# Patient Record
Sex: Male | Born: 1960
Health system: Southern US, Community
[De-identification: ages and names within clinical notes are randomized; demographics above are authoritative.]

## PROBLEM LIST (undated history)

## (undated) DIAGNOSIS — M1990 Unspecified osteoarthritis, unspecified site: Secondary | ICD-10-CM

## (undated) DIAGNOSIS — H57051 Tonic pupil, right eye: Secondary | ICD-10-CM

## (undated) DIAGNOSIS — T4145XA Adverse effect of unspecified anesthetic, initial encounter: Secondary | ICD-10-CM

## (undated) DIAGNOSIS — B001 Herpesviral vesicular dermatitis: Secondary | ICD-10-CM

## (undated) DIAGNOSIS — R112 Nausea with vomiting, unspecified: Secondary | ICD-10-CM

## (undated) DIAGNOSIS — I10 Essential (primary) hypertension: Secondary | ICD-10-CM

## (undated) DIAGNOSIS — R195 Other fecal abnormalities: Secondary | ICD-10-CM

## (undated) DIAGNOSIS — E785 Hyperlipidemia, unspecified: Secondary | ICD-10-CM

## (undated) DIAGNOSIS — T8859XA Other complications of anesthesia, initial encounter: Secondary | ICD-10-CM

## (undated) DIAGNOSIS — F419 Anxiety disorder, unspecified: Secondary | ICD-10-CM

## (undated) DIAGNOSIS — Z9889 Other specified postprocedural states: Secondary | ICD-10-CM

## (undated) HISTORY — DX: Essential (primary) hypertension: I10

## (undated) HISTORY — PX: OTHER SURGICAL HISTORY: SHX169

## (undated) HISTORY — DX: Tonic pupil, right eye: H57.051

## (undated) HISTORY — DX: Unspecified osteoarthritis, unspecified site: M19.90

## (undated) HISTORY — DX: Other fecal abnormalities: R19.5

## (undated) HISTORY — PX: SHOULDER ARTHROSCOPY: SHX128

## (undated) HISTORY — DX: Hyperlipidemia, unspecified: E78.5

## (undated) HISTORY — DX: Herpesviral vesicular dermatitis: B00.1

## (undated) HISTORY — DX: Anxiety disorder, unspecified: F41.9

---

## 1999-07-19 ENCOUNTER — Emergency Department (HOSPITAL_COMMUNITY): Admission: EM | Admit: 1999-07-19 | Discharge: 1999-07-19 | Payer: Self-pay | Admitting: Emergency Medicine

## 1999-07-19 ENCOUNTER — Encounter: Payer: Self-pay | Admitting: Emergency Medicine

## 2012-05-15 ENCOUNTER — Ambulatory Visit (INDEPENDENT_AMBULATORY_CARE_PROVIDER_SITE_OTHER): Payer: 59 | Admitting: Family Medicine

## 2012-05-15 ENCOUNTER — Encounter: Payer: Self-pay | Admitting: Family Medicine

## 2012-05-15 VITALS — BP 166/96 | HR 76 | Temp 98.0°F | Resp 16 | Ht 71.0 in | Wt 178.0 lb

## 2012-05-15 DIAGNOSIS — F411 Generalized anxiety disorder: Secondary | ICD-10-CM

## 2012-05-15 DIAGNOSIS — Z0000 Encounter for general adult medical examination without abnormal findings: Secondary | ICD-10-CM

## 2012-05-15 DIAGNOSIS — I10 Essential (primary) hypertension: Secondary | ICD-10-CM

## 2012-05-15 DIAGNOSIS — F419 Anxiety disorder, unspecified: Secondary | ICD-10-CM

## 2012-05-15 LAB — COMPREHENSIVE METABOLIC PANEL
ALT: 29 U/L (ref 0–53)
AST: 28 U/L (ref 0–37)
Albumin: 4 g/dL (ref 3.5–5.2)
Alkaline Phosphatase: 77 U/L (ref 39–117)
BUN: 17 mg/dL (ref 6–23)
CO2: 28 mEq/L (ref 19–32)
Calcium: 9.3 mg/dL (ref 8.4–10.5)
Chloride: 107 mEq/L (ref 96–112)
Creat: 1.2 mg/dL (ref 0.50–1.35)
Glucose, Bld: 92 mg/dL (ref 70–99)
Potassium: 4.4 mEq/L (ref 3.5–5.3)
Sodium: 141 mEq/L (ref 135–145)
Total Bilirubin: 0.7 mg/dL (ref 0.3–1.2)
Total Protein: 6.4 g/dL (ref 6.0–8.3)

## 2012-05-15 LAB — LIPID PANEL
Cholesterol: 210 mg/dL — ABNORMAL HIGH (ref 0–200)
HDL: 45 mg/dL (ref >39)
LDL Cholesterol: 145 mg/dL — ABNORMAL HIGH (ref 0–99)
Total CHOL/HDL Ratio: 4.7 Ratio
Triglycerides: 100 mg/dL (ref <150)
VLDL: 20 mg/dL (ref 0–40)

## 2012-05-15 LAB — CBC
HCT: 43.3 % (ref 39.0–52.0)
Hemoglobin: 15.4 g/dL (ref 13.0–17.0)
MCH: 31.6 pg (ref 26.0–34.0)
MCHC: 35.6 g/dL (ref 30.0–36.0)
MCV: 88.9 fL (ref 78.0–100.0)
Platelets: 283 10*3/uL (ref 150–400)
RBC: 4.87 MIL/uL (ref 4.22–5.81)
RDW: 12.6 % (ref 11.5–15.5)
WBC: 5.5 10*3/uL (ref 4.0–10.5)

## 2012-05-15 MED ORDER — ALPRAZOLAM 0.25 MG PO TABS: 0.2500 mg | ORAL_TABLET | Freq: Three times a day (TID) | ORAL | Status: DC | PRN

## 2012-05-15 MED ORDER — LISINOPRIL 5 MG PO TABS: 5.0000 mg | ORAL_TABLET | Freq: Every day | ORAL | Status: DC

## 2012-05-15 NOTE — Patient Instructions (Signed)
Keep a record of your blood pressures outside of the office and bring them to the next office visit. Your should receive a call or letter about your lab results within the next week to 10 days.  You can schedule a screening colonoscopy after 50 years old.  Recheck in the next 6 to 8 weeks to recheck blood pressure and discuss other concerns from this visit. Return to the clinic or go to the nearest emergency room if any of your symptoms worsen or new symptoms occur.

## 2012-05-15 NOTE — Progress Notes (Signed)
Subjective:    Patient ID: Kelly Sanders, male    DOB: May 28, 1961, 51 y.o.   MRN: [id number]  HPI  Kelly Sanders is a 51 y.o. male Here for CPE.   Also notes increasing back pain for 3 years.  - seen by spine specialist 2 years ago - x ray ok.  Mid to lower back.  No bowel or bladder incontinence, no saddle anesthesia, no lower extremity weakness.   Hurts when sleeping tried different beds, etc.   Episodic anxiety - every few months.  On and off wellbutin and cymbalta for pst 9 years for depression.  Dizzy with cymbalta? As needed xanax - up to each day for few days, then not needed for weeks.  Stress at work for years.  No suicide thoughts.  More anxious than depressed.  No SI.  Trouble with concentration past few years. Not necessarily associated with stress. Memory bad at times.  Geophysicist/field seismologist.  Outside BP's 180/90 at times, usually in 170's.    Review of Systems See scanned PHS.     Objective:   Physical Exam  Constitutional: He is oriented to person, place, and time. He appears well-developed and well-nourished.  HENT:  Head: Normocephalic and atraumatic.  Right Ear: External ear normal.  Left Ear: External ear normal.  Mouth/Throat: Oropharynx is clear and moist.  Eyes: Conjunctivae and EOM are normal. Pupils are equal, round, and reactive to light.  Neck: Normal range of motion. Neck supple. No thyromegaly present.  Cardiovascular: Normal rate, regular rhythm, normal heart sounds and intact distal pulses.   Pulmonary/Chest: Effort normal and breath sounds normal. No respiratory distress. He has no wheezes.  Abdominal: Soft. He exhibits no distension. There is no tenderness. Hernia confirmed negative in the right inguinal area and confirmed negative in the left inguinal area.  Genitourinary: Prostate normal.  Musculoskeletal: Normal range of motion. He exhibits no edema and no tenderness.  Lymphadenopathy:    He has no cervical adenopathy.    Neurological: He is alert and oriented to person, place, and time. He has normal reflexes.  Skin: Skin is warm and dry.  Psychiatric: He has a normal mood and affect. His behavior is normal.    EKG: no apparent change form 4/12.      Assessment & Plan:  Kelly Sanders is a 51 y.o. male 1. Physical exam, annual  PSA, CBC, Comprehensive metabolic panel, Lipid panel, EKG 12-Lead  2. Hypertension  PSA, CBC, Comprehensive metabolic panel, Lipid panel, EKG 12-Lead  3. Anxiety     CPE- Check pSa, lipids, CMP,CBC.  Phone numbers for setting up screening colonoscopy.   Anxiety - episodic.  No SSRI desired at this point, but can try Anxiety for Dummies book, and recheck to discuss further. Cont xanax 0.25mg  xanax up to TID - but infrequent use. Discuss concentration/memory further at next visit.  Increase exercise to most days of the week.   Low back pain. Recurrent - back care manual,and rtc to discuss and review prior evals in next 2-3 months.  Sooner if worse.   HTn - based on outside BP's and prior readings.  Start lisinopril 5mg  qd.  Check outside BP's, creat todayand recheck in 6-8 weeks.   Patient Instructions  Keep a record of your blood pressures outside of the office and bring them to the next office visit. Your should receive a call or letter about your lab results within the next week to 10 days.  You can  schedule a screening colonoscopy after 50 years old.  Recheck in the next 6 to 8 weeks to recheck blood pressure and discuss other concerns from this visit. Return to the clinic or go to the nearest emergency room if any of your symptoms worsen or new symptoms occur.

## 2012-05-16 LAB — PSA: PSA: 0.22 ng/mL (ref <=4.00)

## 2012-06-11 ENCOUNTER — Telehealth: Payer: Self-pay

## 2012-06-11 NOTE — Telephone Encounter (Signed)
PT STATES HE RECEIVED A LETTER STATING WE WERE TRYING TO GET IN [facility] WITH HIM REGARDING HIS LABS PLEASE CALL [phone]

## 2012-06-12 ENCOUNTER — Telehealth: Payer: Self-pay

## 2012-06-12 NOTE — Telephone Encounter (Signed)
Dr Neva Seat, your notes commenting on pt's labs are not in pts chart. All that shows is your comment to "see other notes". Did you have any instr's concerning pts elevated lipids, or just have him return in 6-8 weeks as in your OV notes and you will discuss at that time?

## 2012-06-12 NOTE — Telephone Encounter (Signed)
Pt calling states no one has returned his call about labs, his phone numbers have changed and have been noted in his mr please contact pt on his cell [phone]

## 2012-06-22 NOTE — Telephone Encounter (Signed)
Call pt - cholesterol mildly elevated at last ov. Other labs including electrolytes, psa and blood counts ok.   Can discuss options, including if meds needed at follow up visit.  Should be rechecking in next few weeks to recheck kidney function and blood pressure.  please contact pt on his cell [phone].

## 2012-06-22 NOTE — Telephone Encounter (Signed)
Gave pt results and instr's from Dr Neva Seat. Pt agreed to f/up in a few weeks as planned.

## 2012-07-10 ENCOUNTER — Ambulatory Visit: Payer: 59 | Admitting: Family Medicine

## 2012-07-17 ENCOUNTER — Encounter: Payer: Self-pay | Admitting: Family Medicine

## 2012-07-17 ENCOUNTER — Ambulatory Visit (INDEPENDENT_AMBULATORY_CARE_PROVIDER_SITE_OTHER): Payer: 59 | Admitting: Family Medicine

## 2012-07-17 VITALS — BP 152/68 | HR 74 | Temp 97.9°F | Resp 16 | Ht 70.0 in | Wt 178.0 lb

## 2012-07-17 DIAGNOSIS — F419 Anxiety disorder, unspecified: Secondary | ICD-10-CM

## 2012-07-17 DIAGNOSIS — I10 Essential (primary) hypertension: Secondary | ICD-10-CM

## 2012-07-17 DIAGNOSIS — E785 Hyperlipidemia, unspecified: Secondary | ICD-10-CM

## 2012-07-17 DIAGNOSIS — M545 Low back pain, unspecified: Secondary | ICD-10-CM

## 2012-07-17 DIAGNOSIS — F411 Generalized anxiety disorder: Secondary | ICD-10-CM

## 2012-07-17 DIAGNOSIS — M25529 Pain in unspecified elbow: Secondary | ICD-10-CM

## 2012-07-17 DIAGNOSIS — M549 Dorsalgia, unspecified: Secondary | ICD-10-CM

## 2012-07-17 LAB — BASIC METABOLIC PANEL
BUN: 14 mg/dL (ref 6–23)
CO2: 30 mEq/L (ref 19–32)
Calcium: 9.5 mg/dL (ref 8.4–10.5)
Chloride: 104 mEq/L (ref 96–112)
Creat: 1.11 mg/dL (ref 0.50–1.35)
Glucose, Bld: 94 mg/dL (ref 70–99)
Potassium: 3.9 mEq/L (ref 3.5–5.3)
Sodium: 140 mEq/L (ref 135–145)

## 2012-07-17 MED ORDER — LISINOPRIL 5 MG PO TABS: 5.0000 mg | ORAL_TABLET | Freq: Every day | ORAL | Status: DC

## 2012-07-17 MED ORDER — ALPRAZOLAM 0.25 MG PO TABS: 0.2500 mg | ORAL_TABLET | Freq: Three times a day (TID) | ORAL | Status: DC | PRN

## 2012-07-17 NOTE — Progress Notes (Signed)
Subjective:    Patient ID: Kelly Sanders, male    DOB: 1961-03-23, 51 y.o.   MRN: [id number]  HPI Kelly Sanders is a 51 y.o. male  See last ov.   HTN - outside Bp's. [phone].  No new side effects with meds. Started lisinopril 5mg  in July.   No missed doses.   Hyperlipidemia - labs from last office visit: Results for orders placed in visit on 05/15/12  PSA      Component Value Range   PSA 0.22  <=4.00 ng/mL  CBC      Component Value Range   WBC 5.5  4.0 - 10.5 K/uL   RBC 4.87  4.22 - 5.81 MIL/uL   Hemoglobin 15.4  13.0 - 17.0 g/dL   HCT 82.9  56.2 - 13.0 %   MCV 88.9  78.0 - 100.0 fL   MCH 31.6  26.0 - 34.0 pg   MCHC 35.6  30.0 - 36.0 g/dL   RDW 86.5  78.4 - 69.6 %   Platelets 283  150 - 400 K/uL  COMPREHENSIVE METABOLIC PANEL      Component Value Range   Sodium 141  135 - 145 mEq/L   Potassium 4.4  3.5 - 5.3 mEq/L   Chloride 107  96 - 112 mEq/L   CO2 28  19 - 32 mEq/L   Glucose, Bld 92  70 - 99 mg/dL   BUN 17  6 - 23 mg/dL   Creat 2.95  2.84 - 1.32 mg/dL   Total Bilirubin 0.7  0.3 - 1.2 mg/dL   Alkaline Phosphatase 77  39 - 117 U/L   AST 28  0 - 37 U/L   ALT 29  0 - 53 U/L   Total Protein 6.4  6.0 - 8.3 g/dL   Albumin 4.0  3.5 - 5.2 g/dL   Calcium 9.3  8.4 - 44.0 mg/dL  LIPID PANEL      Component Value Range   Cholesterol 210 (*) 0 - 200 mg/dL   Triglycerides 102  <725 mg/dL   HDL 45  >36 mg/dL   Total CHOL/HDL Ratio 4.7     VLDL 20  0 - 40 mg/dL   LDL Cholesterol 644 (*) 0 - 99 mg/dL   No change in diet - fish oil every now and then. No known hx early CAD or CVD. Would like to try diet and exercise for few months.  Occasional problems with anxiety - can occur for a week at a time - then some weeks none.  Only taking xanax 6 times per month (usually after 2 days in a row of anxiety).  Has not purchased anxiety for Dummies. Would consider therapist to discuss this, but intolerance to SSRI's in past.   Other concerns: R Elbow problem - seen by Dr.  Melvyn Novas in past - told needed surgery. Told needed bone spur removed. Has occasional flair in pain.  Plans on calling ortho to follow up with this.   Back pain - See last ov - back pain - recurrent for years. - takes 4 ibuprofen before bed - sometimes in morning.  No bowel or bladder incontinence, no saddle anesthesia, no lower extremity weakness. Seen by specialist in past - ?arthritis due to old fracture? No recent changes in sx's.   Review of Systems  Constitutional: Negative for fatigue and unexpected weight change.  Respiratory: Negative for cough, chest tightness and shortness of breath.   Cardiovascular: Negative for chest pain, palpitations and leg  swelling.  Genitourinary: Negative for difficulty urinating.  Musculoskeletal: Positive for myalgias and back pain.  Neurological: Negative for dizziness, weakness, light-headedness and headaches.       No le weakness. No bowel/bladder incontinence, no saddle anesthesia.   As above.     Objective:   Physical Exam  Musculoskeletal:       Lumbar back: He exhibits normal range of motion, no tenderness and no bony tenderness.       Back:       Arms: Neurological: He has normal strength. No sensory deficit.  Reflex Scores:      Patellar reflexes are 2+ on the right side and 2+ on the left side.      Achilles reflexes are 2+ on the right side and 2+ on the left side.      Normal heel to toe walk.         Assessment & Plan:  Kelly Sanders is a 51 y.o. male 1. Essential hypertension, benign  Basic metabolic panel  2. Lumbago    3. Other and unspecified hyperlipidemia    4. Anxiety state, unspecified    5. Elbow pain      HTn - borderline - possible component due to NSAIDS - will keep same dose of meds at present and decrease nsaids. Recheck in 3 months. Check outside bp's.   LBP - try to transition to tylenol, less ibuprofen, and try back care manual HEP.  Consider reimaging after review of prior records and eval.  R elbow  pain - intermittent with flairs and underlying bone spur. Plan to follow up with Dr. Melvyn Novas as eval there prior.   Anxiety - intermittent flairs. SSRI deferred at this point, can continue xanax prn. Phone numbers for therapists given as he may be amenable to this approach now.  Can also pick up Anxiety for Dummies book as discussed prior. .   Hyperlipidemia - work on diet and increase fish oil.  Recheck in 3 months.  Discussed statin - declined at present - will try diet first.

## 2012-07-17 NOTE — Patient Instructions (Addendum)
Recheck  In the next few months for your back pain.  Try tylenol and lower dose of ibuprofen, and exercises in manual.  Keep a record of your blood pressures outside of the office and bring them to the next office visit. Fish oil and diet changes for cholesterol, and recheck levels in 3 months.  Your should receive a call or letter about your lab results within the next week to 10 days.   Counselors: Arbutus Ped: [phone] Barbie Haggis: [phone]

## 2012-07-18 DIAGNOSIS — F419 Anxiety disorder, unspecified: Secondary | ICD-10-CM | POA: Insufficient documentation

## 2012-07-18 DIAGNOSIS — I10 Essential (primary) hypertension: Secondary | ICD-10-CM | POA: Insufficient documentation

## 2012-09-21 ENCOUNTER — Ambulatory Visit: Payer: 59 | Admitting: Family Medicine

## 2012-11-18 ENCOUNTER — Other Ambulatory Visit: Payer: Self-pay | Admitting: Family Medicine

## 2012-11-18 DIAGNOSIS — F419 Anxiety disorder, unspecified: Secondary | ICD-10-CM

## 2012-11-20 NOTE — Telephone Encounter (Signed)
Patient advised.

## 2013-02-06 ENCOUNTER — Other Ambulatory Visit: Payer: Self-pay | Admitting: Family Medicine

## 2013-02-06 NOTE — Telephone Encounter (Signed)
Per last OV pt to follow up Dec 2013 for recheck of BP

## 2013-03-21 ENCOUNTER — Other Ambulatory Visit: Payer: Self-pay | Admitting: Family Medicine

## 2013-06-29 ENCOUNTER — Other Ambulatory Visit: Payer: Self-pay | Admitting: Physician Assistant

## 2013-06-30 NOTE — Telephone Encounter (Signed)
Needs OV.  

## 2013-08-02 ENCOUNTER — Encounter: Payer: Self-pay | Admitting: Family Medicine

## 2013-08-02 ENCOUNTER — Ambulatory Visit (INDEPENDENT_AMBULATORY_CARE_PROVIDER_SITE_OTHER): Payer: 59 | Admitting: Family Medicine

## 2013-08-02 VITALS — BP 133/80 | HR 67 | Temp 98.7°F | Resp 16 | Ht 70.5 in | Wt 182.0 lb

## 2013-08-02 DIAGNOSIS — F411 Generalized anxiety disorder: Secondary | ICD-10-CM

## 2013-08-02 DIAGNOSIS — I10 Essential (primary) hypertension: Secondary | ICD-10-CM

## 2013-08-02 DIAGNOSIS — E785 Hyperlipidemia, unspecified: Secondary | ICD-10-CM

## 2013-08-02 MED ORDER — LOSARTAN POTASSIUM 50 MG PO TABS: 25.0000 mg | ORAL_TABLET | Freq: Every day | ORAL | Status: DC

## 2013-08-02 MED ORDER — CLONAZEPAM 0.5 MG PO TABS: 0.2500 mg | ORAL_TABLET | Freq: Two times a day (BID) | ORAL | Status: DC | PRN

## 2013-08-02 NOTE — Patient Instructions (Addendum)
Stop lisinopril, stop xanax.  Start 1/2 tablet each day of losartan.  The cough should resolve with this if due to lisinopril. Return in next 2 months for fasting bloodwork (can schedule as a physical if you would like). Keep a record of your blood pressures outside of the office and bring them to the next office visit. Return to the clinic or go to the nearest emergency room if any of your symptoms worsen or new symptoms occur. Stop xanax, ok to try klonopin as discussed, and can pick up the book  Anxiety for Dummies for more information.

## 2013-08-02 NOTE — Progress Notes (Signed)
Subjective:    Patient ID: Kelly Sanders, male    DOB: 1961/02/04, 52 y.o.   MRN: [id number]  HPI  Kelly Sanders is a 52 y.o. male  Last ov 07/17/12. HTN - - borderline at last ov - planned to recheck in 3 months as thought to be NSAID component. No recent follow up.  Outside bp's: 133/86. Occasional neck flushing seen by others, but not felt, and not at times of stress. NO chest pains,    Results for orders placed in visit on 07/17/12  BASIC METABOLIC PANEL      Result Value Range   Sodium 140  135 - 145 mEq/L   Potassium 3.9  3.5 - 5.3 mEq/L   Chloride 104  96 - 112 mEq/L   CO2 30  19 - 32 mEq/L   Glucose, Bld 94  70 - 99 mg/dL   BUN 14  6 - 23 mg/dL   Creat 8.11  9.14 - 7.82 mg/dL   Calcium 9.5  8.4 - 95.6 mg/dL    Anxiety - intermittent flairs. - about twice per month.  SSRI deferred prior. Xanax works ok, but day after feels more anxious.  phone numbers for therapists given last ov, but did not feel like was needed Has not bought Anxiety for Dummies book yet. Would like to try something different than xanax.  Last true panic attack over 6 months ago.   Hyperlipidemia - LDL 145  In July 2013. Plan at last ov to work on diet and increase fish oil and recheck in 3 months. statin declined last ov. Not fasting today. Weight from 178 to 182.   Review of Systems  Constitutional: Negative for unexpected weight change. Fatigue: in the fall.   Eyes: Negative for visual disturbance.  Respiratory: Positive for cough (has had dry cough - since starting lisinopril. ). Negative for chest tightness and shortness of breath.   Cardiovascular: Negative for chest pain, palpitations and leg swelling.  Gastrointestinal: Negative for abdominal pain and blood in stool.  Neurological: Negative for dizziness, light-headedness and headaches.       Objective:   Physical Exam  Vitals reviewed. Constitutional: He is oriented to person, place, and time. He appears well-developed and  well-nourished.  HENT:  Head: Normocephalic and atraumatic.  Eyes: EOM are normal. Pupils are equal, round, and reactive to light.  Neck: No JVD present. Carotid bruit is not present.  Cardiovascular: Normal rate, regular rhythm and normal heart sounds.   No murmur heard. Pulmonary/Chest: Effort normal and breath sounds normal. He has no rales.  Musculoskeletal: He exhibits no edema.  Neurological: He is alert and oriented to person, place, and time.  Skin: Skin is warm and dry.  Psychiatric: He has a normal mood and affect. His behavior is normal. Judgment and thought content normal.      Assessment & Plan:  Kelly Sanders is a 52 y.o. male HTN -borderline, but slight cough - likely ACE-I effect. Stop lisinopril. Start losartan.  Can discuss flushing if persists on new med. Check BMP.   Anxiety state, unspecified - Plan: clonazePAM (KLONOPIN) 0.5 MG tablet rx in place of xanax, discussed Anxiety for Dummies book again. Given infrequent sx's, can hold on SSRI, and counseling at this point.   Other and unspecified hyperlipidemia - plan on FLP at next ov - discussed scheduling physical.  Meds ordered this encounter  Medications  . clonazePAM (KLONOPIN) 0.5 MG tablet    Sig: Take 0.5-1 tablets (0.25-0.5 mg  total) by mouth 2 (two) times daily as needed for anxiety.    Dispense:  30 tablet    Refill:  0  . losartan (COZAAR) 50 MG tablet    Sig: Take 0.5 tablets (25 mg total) by mouth daily.    Dispense:  30 tablet    Refill:  1    Patient Instructions  Stop lisinopril, stop xanax.  Start 1/2 tablet each day of losartan.  The cough should resolve with this if due to lisinopril. Return in next 2 months for fasting bloodwork (can schedule as a physical if you would like). Keep a record of your blood pressures outside of the office and bring them to the next office visit. Return to the clinic or go to the nearest emergency room if any of your symptoms worsen or new symptoms occur. Stop  xanax, ok to try klonopin as discussed, and can pick up the book  Anxiety for Dummies for more information.

## 2013-08-03 LAB — BASIC METABOLIC PANEL
BUN: 18 mg/dL (ref 6–23)
Creat: 1.15 mg/dL (ref 0.50–1.35)
Potassium: 4 mEq/L (ref 3.5–5.3)

## 2013-08-05 ENCOUNTER — Encounter: Payer: Self-pay | Admitting: *Deleted

## 2013-09-09 ENCOUNTER — Other Ambulatory Visit: Payer: Self-pay

## 2013-10-04 ENCOUNTER — Encounter: Payer: Self-pay | Admitting: Family Medicine

## 2013-10-04 ENCOUNTER — Ambulatory Visit (INDEPENDENT_AMBULATORY_CARE_PROVIDER_SITE_OTHER): Payer: 59 | Admitting: Family Medicine

## 2013-10-04 ENCOUNTER — Telehealth: Payer: Self-pay | Admitting: *Deleted

## 2013-10-04 VITALS — BP 116/80 | HR 87 | Temp 98.7°F | Resp 16 | Ht 70.0 in | Wt 182.4 lb

## 2013-10-04 DIAGNOSIS — R1031 Right lower quadrant pain: Secondary | ICD-10-CM

## 2013-10-04 DIAGNOSIS — R195 Other fecal abnormalities: Secondary | ICD-10-CM

## 2013-10-04 DIAGNOSIS — M25559 Pain in unspecified hip: Secondary | ICD-10-CM

## 2013-10-04 DIAGNOSIS — R319 Hematuria, unspecified: Secondary | ICD-10-CM

## 2013-10-04 DIAGNOSIS — M25552 Pain in left hip: Secondary | ICD-10-CM

## 2013-10-04 DIAGNOSIS — Z23 Encounter for immunization: Secondary | ICD-10-CM

## 2013-10-04 DIAGNOSIS — I10 Essential (primary) hypertension: Secondary | ICD-10-CM

## 2013-10-04 DIAGNOSIS — E785 Hyperlipidemia, unspecified: Secondary | ICD-10-CM

## 2013-10-04 DIAGNOSIS — Z0000 Encounter for general adult medical examination without abnormal findings: Secondary | ICD-10-CM

## 2013-10-04 LAB — CBC
MCHC: 35.7 g/dL (ref 30.0–36.0)
MCV: 91.8 fL (ref 78.0–100.0)
Platelets: 266 10*3/uL (ref 150–400)
RBC: 4.76 MIL/uL (ref 4.22–5.81)
RDW: 12.6 % (ref 11.5–15.5)
WBC: 5.2 10*3/uL (ref 4.0–10.5)

## 2013-10-04 LAB — POCT UA - MICROSCOPIC ONLY
Bacteria, U Microscopic: NEGATIVE
Crystals, Ur, HPF, POC: NEGATIVE
Epithelial cells, urine per micros: NEGATIVE

## 2013-10-04 LAB — POCT URINALYSIS DIPSTICK
Ketones, UA: NEGATIVE
Protein, UA: NEGATIVE
Spec Grav, UA: 1.02
Urobilinogen, UA: 0.2
pH, UA: 6

## 2013-10-04 MED ORDER — MELOXICAM 7.5 MG PO TABS: 7.5000 mg | ORAL_TABLET | Freq: Every day | ORAL | Status: DC

## 2013-10-04 MED ORDER — LOSARTAN POTASSIUM 50 MG PO TABS: 25.0000 mg | ORAL_TABLET | Freq: Every day | ORAL | Status: DC

## 2013-10-04 NOTE — Patient Instructions (Addendum)
We will refer you to gastroenterologist for abdominal pain and colonoscopy, but Return to the clinic or go to the nearest emergency room if any of your symptoms worsen or new symptoms occur. We will also refer you to ortho for your hip, but can try tylenol few times per day for now, and stop nsaids for now as possible blood in stool and because of your abdominal pain. Can also decrease exercise to hip for next few weeks.  We will check a urine culture for infection, and return in next few weeks for repeat urine testing  - if blood remains in urine - can discuss further workup then.  You should receive a call or letter about your lab results within the next week to 10 days.   Keeping you healthy  Get these tests  Blood pressure- Have your blood pressure checked once a year by your healthcare provider.  Normal blood pressure is 120/80  Weight- Have your body mass index (BMI) calculated to screen for obesity.  BMI is a measure of body fat based on height and weight. You can also calculate your own BMI at [email or web address].  Cholesterol- Have your cholesterol checked every year.  Diabetes- Have your blood sugar checked regularly if you have high blood pressure, high cholesterol, have a family history of diabetes or if you are overweight.  Screening for Colon Cancer- Colonoscopy starting at age 36.  Screening may begin sooner depending on your family history and other health conditions. Follow up colonoscopy as directed by your Gastroenterologist.  Screening for Prostate Cancer- Both blood work (PSA) and a rectal exam help screen for Prostate Cancer.  Screening begins at age 51 with African-American men and at age 19 with Caucasian men.  Screening may begin sooner depending on your family history.  Take these medicines  Aspirin- One aspirin daily can help prevent Heart disease and Stroke.  Flu shot- Every fall.  Tetanus- Every 10 years.  Zostavax- Once after the age of 13 to prevent  Shingles.  Pneumonia shot- Once after the age of 51; if you are younger than 40, ask your healthcare provider if you need a Pneumonia shot.  Take these steps  Don't smoke- If you do smoke, talk to your doctor about quitting.  For tips on how to quit, go to www.smokefree.gov or call 1-800-QUIT-NOW.  Be physically active- Exercise 5 days a week for at least 30 minutes.  If you are not already physically active start slow and gradually work up to 30 minutes of moderate physical activity.  Examples of moderate activity include walking briskly, mowing the yard, dancing, swimming, bicycling, etc.  Eat a healthy diet- Eat a variety of healthy food such as fruits, vegetables, low fat milk, low fat cheese, yogurt, lean meant, poultry, fish, beans, tofu, etc. For more information go to www.thenutritionsource.org  Drink alcohol in moderation- Limit alcohol intake to less than two drinks a day. Never drink and drive.  Dentist- Brush and floss twice daily; visit your dentist twice a year.  Depression- Your emotional health is as important as your physical health. If you're feeling down, or losing interest in things you would normally enjoy please talk to your healthcare provider.  Eye exam- Visit your eye doctor every year.  Safe sex- If you may be exposed to a sexually transmitted infection, use a condom.  Seat belts- Seat belts can save your life; always wear one.  Smoke/Carbon Monoxide detectors- These detectors need to be installed on the appropriate level of your  home.  Replace batteries at least once a year.  Skin cancer- When out in the sun, cover up and use sunscreen 15 SPF or higher.  Violence- If anyone is threatening you, please tell your healthcare provider.  Living Will/ Health care power of attorney- Speak with your healthcare provider and family.

## 2013-10-04 NOTE — Progress Notes (Signed)
Subjective:    Patient ID: Kelly Sanders, male    DOB: Apr 17, 1961, 52 y.o.   MRN: [id number]  HPI [name] is a 52 y.o. male Here for CPE/annual exam.  Last seen for medication follow up 08/02/13.  Colonoscopy: has not had done. No FH of colon CA.  PSA: no FH of prostate cancer. Agrees to have testing today.  Lab Results  Component Value Date   PSA 0.22 05/15/2012  Flu vaccine:not yet had.  Refuses today. Feels like made him sick past 2 times. Discussed unlikely d/t flu vaccine, but still declines.  Tdap: unknown - at least  7 or 8 years maybe more. Will have today.  Optho: no recent eval  - about 4-5 years ago.  Dentist: last eval few weeks ago.  Advanced Directives: has not discussed  With family, but will. Full code.   HTN -borderline, but slight cough last ov,likely ACE-I effect. Stopped lisinopril. Started losartan 50mg  qd. Creat 1.15 last ov. Home BP: 118/80.  No new side effects of meds.     Chemistry      Component Value Date/Time   NA 140 08/02/2013 1720   K 4.0 08/02/2013 1720   CL 101 08/02/2013 1720   CO2 26 08/02/2013 1720   BUN 18 08/02/2013 1720   CREATININE 1.15 08/02/2013 1720      Component Value Date/Time   CALCIUM 9.6 08/02/2013 1720   ALKPHOS 77 05/15/2012 0831   AST 28 05/15/2012 0831   ALT 29 05/15/2012 0831   BILITOT 0.7 05/15/2012 0831     Anxiety state, unspecified - Klonopin rx in place of xanax last ov as felt like more anxiety day after taking Xanax. discussed Anxiety for Dummies book again last ov. Infrequent panic sx's. Has only taken Klnopin once -worked ok.  Exercise helping with anxiety.   Hyperlipidemia - plan on FLP today. Prior lipids as below in 2013 with LDL 145. Plan for recheck then in 3 months after diet/exercise. Declined statin then.  Fasting labs drawn this am.   Lab Results  Component Value Date   CHOL 210* 05/15/2012   HDL 45 05/15/2012   LDLCALC 145* 05/15/2012   TRIG 100 05/15/2012   CHOLHDL 4.7 05/15/2012   R lower  abdominal pain - off and on for a year - last few minutes, and goes away on own. Every few weeks. Few weeks ago - lasted few days, dull pain, then resolves. No N/V/D/C, bowels normal during episodes. No blood in stool.  No fever, no urinary symptoms at time of pain. Last had a week ago, minimal sore with pressing on area only. Tx: ibuprofen for back pain only.   L hip pain - past 1 year. NKI. Has not been evaluated for this, but has seen [place] [facility] for back, shoulder surgeries. Last few months seem worse, but has increased exercise - [employer] 3x week - swim, raquetball, weights, treadmill, elliptical. Noticed more with exercise bike. Tx: ibuprofen for back - about 4-5 times per week. Would like to see [facility] - Dr. Thomasena Edis seen in past.     Review of Systems 13 point review of systems per patient health survey noted.  Negative other than as indicated on reviewed nursing note or above.      Objective:   Physical Exam  Vitals reviewed. Constitutional: He is oriented to person, place, and time. He appears well-developed and well-nourished.  HENT:  Head: Normocephalic and atraumatic.  Right Ear: External ear normal.  Left Ear: External ear normal.  Mouth/Throat: Oropharynx is clear and moist.  Eyes: Conjunctivae and EOM are normal. Pupils are equal, round, and reactive to light.  Neck: Normal range of motion. Neck supple. No thyromegaly present.  Cardiovascular: Normal rate, regular rhythm, normal heart sounds and intact distal pulses.   Pulmonary/Chest: Effort normal and breath sounds normal. No respiratory distress. He has no wheezes.  Abdominal: Soft. Normal appearance and bowel sounds are normal. He exhibits no distension. There is no hepatosplenomegaly. There is no tenderness (min RLQ with deep palpation only. ). No hernia. Hernia confirmed negative in the right inguinal area and confirmed negative in the left inguinal area.  Genitourinary: Prostate normal.  Musculoskeletal: Normal range  of motion. He exhibits no edema and no tenderness.  Lymphadenopathy:    He has no cervical adenopathy.  Neurological: He is alert and oriented to person, place, and time. He has normal reflexes.  Skin: Skin is warm and dry.  Psychiatric: He has a normal mood and affect. His behavior is normal.   Filed Vitals:   10/04/13 1418  BP: 116/80  Pulse: 87  Temp: 98.7 F (37.1 C)  TempSrc: Oral  Resp: 16  Height: 5\' 10"  (1.778 m)  Weight: 182 lb 6.4 oz (82.736 kg)  SpO2: 96%    Visual Acuity Screening   Right eye Left eye Both eyes  Without correction: 20/25 20/40 20/20   With correction:      Results for orders placed in visit on 10/04/13  POCT UA - MICROSCOPIC ONLY      Result Value Range   WBC, Ur, HPF, POC 0-1     RBC, urine, microscopic 3-6     Bacteria, U Microscopic neg     Mucus, UA trace     Epithelial cells, urine per micros neg     Crystals, Ur, HPF, POC neg     Casts, Ur, LPF, POC neg     Yeast, UA neg    POCT URINALYSIS DIPSTICK      Result Value Range   Color, UA yellow     Clarity, UA clear     Glucose, UA neg     Bilirubin, UA neg     Ketones, UA neg     Spec Grav, UA 1.020     Blood, UA small     pH, UA 6.0     Protein, UA neg     Urobilinogen, UA 0.2     Nitrite, UA neg     Leukocytes, UA Negative    IFOBT (OCCULT BLOOD)      Result Value Range   IFOBT Positive        Assessment & Plan:   Kelly Sanders is a 52 y.o. male HTN (hypertension) - Plan: losartan (COZAAR) 50 MG tablet, Comprehensive metabolic panel, Lipid panel - stable, cont cozaar, cmp pending.  Other and unspecified hyperlipidemia - Plan: Comprehensive metabolic panel, Lipid panel drawn. Discussed possible statin if still elevated, but now that he is exercising - should be improving.  Annual physical exam - Plan: PSA, CBC, POCT UA - Microscopic Only, POCT urinalysis dipstick, TSH, IFOBT POC (occult bld, rslt in office).  Labs above, anticipatory guidance given. Flu vaccine  refused.  Left hip pain - Plan: Ambulatory referral to Orthopedic Surgery, DISCONTINUED: meloxicam (MOBIC) 7.5 MG tablet.  Suspect OA vs overuse component with incr in exercise, but given duration of sx's - refer to ortho.  Discussed XR, but will have done there likely, and  tylenol if needed (initially Rx Mobic. But with hematuria and heme positive stool, with abd pain intermittent - avoid this and nsaids for now.   RLQ abdominal pain - Plan: IFOBT POC (occult bld, rslt in office), Ambulatory referral to Gastroenterology, Urine culture. Intermittent, longstanding. Min on exam today - eval by GI, but if F/C/N/V, or any worsening - rtc or ER to eval.    Need for prophylactic vaccination with combined diphtheria-tetanus-pertussis (DTP) vaccine - Plan: Tdap vaccine greater than or equal to 7yo IM updated  Hematuria - Plan: Urine culture. DDX of nephrolith with intermittent RLQ pain, but will recheck U/a in few weeks and consider urology eval if persistent.   Heme positive stool - Plan: Ambulatory referral to Gastroenterology as above and due for colonoscopy as well from screening standpoint.    Meds ordered this encounter  Medications  . losartan (COZAAR) 50 MG tablet    Sig: Take 0.5 tablets (25 mg total) by mouth daily.    Dispense:  90 tablet    Refill:  1  . DISCONTD: meloxicam (MOBIC) 7.5 MG tablet    Sig: Take 1 tablet (7.5 mg total) by mouth daily.    Dispense:  30 tablet    Refill:  0   Patient Instructions  We will refer you to gastroenterologist for abdominal pain and colonoscopy, but Return to the clinic or go to the nearest emergency room if any of your symptoms worsen or new symptoms occur. We will also refer you to ortho for your hip, but can try tylenol few times per day for now, and stop nsaids for now as possible blood in stool and because of your abdominal pain. Can also decrease exercise to hip for next few weeks.  We will check a urine culture for infection, and return in  next few weeks for repeat urine testing  - if blood remains in urine - can discuss further workup then.  You should receive a call or letter about your lab results within the next week to 10 days.   Keeping you healthy  Get these tests  Blood pressure- Have your blood pressure checked once a year by your healthcare provider.  Normal blood pressure is 120/80  Weight- Have your body mass index (BMI) calculated to screen for obesity.  BMI is a measure of body fat based on height and weight. You can also calculate your own BMI at [email or web address].  Cholesterol- Have your cholesterol checked every year.  Diabetes- Have your blood sugar checked regularly if you have high blood pressure, high cholesterol, have a family history of diabetes or if you are overweight.  Screening for Colon Cancer- Colonoscopy starting at age 79.  Screening may begin sooner depending on your family history and other health conditions. Follow up colonoscopy as directed by your Gastroenterologist.  Screening for Prostate Cancer- Both blood work (PSA) and a rectal exam help screen for Prostate Cancer.  Screening begins at age 46 with African-American men and at age 56 with Caucasian men.  Screening may begin sooner depending on your family history.  Take these medicines  Aspirin- One aspirin daily can help prevent Heart disease and Stroke.  Flu shot- Every fall.  Tetanus- Every 10 years.  Zostavax- Once after the age of 2 to prevent Shingles.  Pneumonia shot- Once after the age of 67; if you are younger than 79, ask your healthcare provider if you need a Pneumonia shot.  Take these steps  Don't smoke- If you do smoke, talk  to your doctor about quitting.  For tips on how to quit, go to www.smokefree.gov or call 1-800-QUIT-NOW.  Be physically active- Exercise 5 days a week for at least 30 minutes.  If you are not already physically active start slow and gradually work up to 30 minutes of moderate physical  activity.  Examples of moderate activity include walking briskly, mowing the yard, dancing, swimming, bicycling, etc.  Eat a healthy diet- Eat a variety of healthy food such as fruits, vegetables, low fat milk, low fat cheese, yogurt, lean meant, poultry, fish, beans, tofu, etc. For more information go to www.thenutritionsource.org  Drink alcohol in moderation- Limit alcohol intake to less than two drinks a day. Never drink and drive.  Dentist- Brush and floss twice daily; visit your dentist twice a year.  Depression- Your emotional health is as important as your physical health. If you're feeling down, or losing interest in things you would normally enjoy please talk to your healthcare provider.  Eye exam- Visit your eye doctor every year.  Safe sex- If you may be exposed to a sexually transmitted infection, use a condom.  Seat belts- Seat belts can save your life; always wear one.  Smoke/Carbon Monoxide detectors- These detectors need to be installed on the appropriate level of your home.  Replace batteries at least once a year.  Skin cancer- When out in the sun, cover up and use sunscreen 15 SPF or higher.  Violence- If anyone is threatening you, please tell your healthcare provider.  Living Will/ Health care power of attorney- Speak with your healthcare provider and family.

## 2013-10-04 NOTE — Progress Notes (Signed)
   Subjective:    Patient ID: KEY CEN, male    DOB: May 31, 1961, 52 y.o.   MRN: [id number]  HPI    Review of Systems  Constitutional: Negative.   HENT: Negative.   Eyes: Negative.   Respiratory: Negative.   Cardiovascular: Negative.   Gastrointestinal: Positive for abdominal pain.  Endocrine: Negative.   Genitourinary: Negative.   Musculoskeletal: Positive for arthralgias and back pain.  Skin: Negative.   Allergic/Immunologic: Negative.   Neurological: Negative.   Hematological: Negative.   Psychiatric/Behavioral: The patient is nervous/anxious.        Objective:   Physical Exam        Assessment & Plan:

## 2013-10-04 NOTE — Telephone Encounter (Signed)
Called patient's pharmacy spoke to [facility] and advised her to cancel medication for Mobic, per Dr Neva Seat.

## 2013-10-05 LAB — COMPREHENSIVE METABOLIC PANEL
ALT: 23 U/L (ref 0–53)
Albumin: 4.2 g/dL (ref 3.5–5.2)
Alkaline Phosphatase: 81 U/L (ref 39–117)
CO2: 26 mEq/L (ref 19–32)
Calcium: 9.3 mg/dL (ref 8.4–10.5)
Chloride: 106 mEq/L (ref 96–112)
Creat: 1.14 mg/dL (ref 0.50–1.35)
Sodium: 141 mEq/L (ref 135–145)
Total Protein: 6.5 g/dL (ref 6.0–8.3)

## 2013-10-05 LAB — TSH: TSH: 2.682 u[IU]/mL (ref 0.350–4.500)

## 2013-10-05 LAB — URINE CULTURE: Organism ID, Bacteria: NO GROWTH

## 2013-10-05 LAB — LIPID PANEL: Cholesterol: 232 mg/dL — ABNORMAL HIGH (ref 0–200)

## 2013-10-19 ENCOUNTER — Encounter: Payer: Self-pay | Admitting: Internal Medicine

## 2013-11-15 ENCOUNTER — Encounter: Payer: Self-pay | Admitting: Family Medicine

## 2013-11-15 ENCOUNTER — Ambulatory Visit (INDEPENDENT_AMBULATORY_CARE_PROVIDER_SITE_OTHER): Payer: 59 | Admitting: Family Medicine

## 2013-11-15 VITALS — BP 150/90 | HR 65 | Temp 99.5°F | Resp 16 | Ht 70.75 in | Wt 183.4 lb

## 2013-11-15 DIAGNOSIS — R319 Hematuria, unspecified: Secondary | ICD-10-CM

## 2013-11-15 DIAGNOSIS — I10 Essential (primary) hypertension: Secondary | ICD-10-CM

## 2013-11-15 LAB — POCT UA - MICROSCOPIC ONLY
BACTERIA, U MICROSCOPIC: NEGATIVE
CASTS, UR, LPF, POC: NEGATIVE
CRYSTALS, UR, HPF, POC: NEGATIVE
Epithelial cells, urine per micros: NEGATIVE
Mucus, UA: NEGATIVE
RBC, urine, microscopic: NEGATIVE
WBC, Ur, HPF, POC: NEGATIVE
Yeast, UA: NEGATIVE

## 2013-11-15 LAB — POCT URINALYSIS DIPSTICK
Bilirubin, UA: NEGATIVE
Glucose, UA: NEGATIVE
Ketones, UA: NEGATIVE
Leukocytes, UA: NEGATIVE
Nitrite, UA: NEGATIVE
PH UA: 7
PROTEIN UA: NEGATIVE
SPEC GRAV UA: 1.02
Urobilinogen, UA: 0.2

## 2013-11-15 NOTE — Patient Instructions (Addendum)
Keep a record of your blood pressures outside of the office and if remain elevated over next 2 weeks - call me to discuss options.   No blood seen on microscopiuc urine test today - recheck at next physical. If present then, could consider urology evaluation.  Return to the clinic or go to the nearest emergency room if any of your symptoms worsen or new symptoms occur.

## 2013-11-15 NOTE — Progress Notes (Signed)
Subjective:    Patient ID: Kelly Sanders, male    DOB: [birth year], 53 y.o.   MRN: [id number]  HPI Kelly Sanders is a 53 y.o. male Left hip pain  - [facility] - started in Indomethacin - 2 capsules twice per day for arthritis. Following up in a few weeks. unknown dose - [facility]. hx of HTN, no home readings. No missed doses of Cozaar.   Microscopic hematuria on last U/a at CPE in December. Here for repeat. No dysuria, frequency, or visible hematuria, no nocturia.  No new back or abd pain. No FH of bladder tumors/cancer, nonsmoker. Creat 1.14 on 10/04/13 labs.   Heme positive stool from CPE- has appt with GI next Monday.     Chemistry      Component Value Date/Time   NA 141 10/04/2013 1516   K 4.3 10/04/2013 1516   CL 106 10/04/2013 1516   CO2 26 10/04/2013 1516   BUN 18 10/04/2013 1516   CREATININE 1.14 10/04/2013 1516      Component Value Date/Time   CALCIUM 9.3 10/04/2013 1516   ALKPHOS 81 10/04/2013 1516   AST 24 10/04/2013 1516   ALT 23 10/04/2013 1516   BILITOT 0.5 10/04/2013 1516        Patient Active Problem List   Diagnosis Date Noted  . HTN (hypertension) 07/18/2012  . Anxiety 07/18/2012   Past Medical History  Diagnosis Date  . Anxiety   . Hypertension   . Hyperlipidemia    No past surgical history on file. Allergies  Allergen Reactions  . Biaxin [Clarithromycin] Rash  . Penicillins Rash  . Sulfa Antibiotics Rash   Prior to Admission medications   Medication Sig Start Date End Date Taking? Authorizing Provider  clonazePAM (KLONOPIN) 0.5 MG tablet Take 0.5-1 tablets (0.25-0.5 mg total) by mouth 2 (two) times daily as needed for anxiety. 08/02/13  Yes Shade FloodJeffrey R Terril Chestnut, MD  losartan (COZAAR) 50 MG tablet Take 0.5 tablets (25 mg total) by mouth daily. 10/04/13  Yes Shade FloodJeffrey R Hedy Garro, MD  PRESCRIPTION MEDICATION Indomethicin taking 2 capsules bid   Yes Historical Provider, MD   History   Social History  . Marital Status: Single      Spouse Name: N/A    Number of Children: N/A  . Years of Education: N/A   Occupational History  . System Analyst    Social History Main Topics  . Smoking status: Never Smoker   . Smokeless tobacco: Not on file  . Alcohol Use: No  . Drug Use: No  . Sexual Activity: Not on file   Other Topics Concern  . Not on file   Social History Narrative   Married. Education: High school.     Review of Systems  Constitutional: Negative for fatigue and unexpected weight change.  Eyes: Negative for visual disturbance.  Respiratory: Negative for cough, chest tightness and shortness of breath.   Cardiovascular: Negative for chest pain, palpitations and leg swelling.  Gastrointestinal: Negative for abdominal pain and blood in stool.  Genitourinary: Negative for dysuria, urgency, hematuria, decreased urine volume and difficulty urinating.  Neurological: Negative for dizziness, light-headedness and headaches.       Objective:   Physical Exam  Vitals reviewed. Constitutional: He is oriented to person, place, and time. He appears well-developed and well-nourished.  HENT:  Head: Normocephalic and atraumatic.  Eyes: EOM are normal. Pupils are equal, round, and reactive to light.  Neck: No JVD present. Carotid bruit is  not present.  Cardiovascular: Normal rate, regular rhythm and normal heart sounds.   No murmur heard. Pulmonary/Chest: Effort normal and breath sounds normal. He has no rales.  Abdominal: Soft. Bowel sounds are normal. There is no tenderness.  Musculoskeletal: He exhibits no edema.  Neurological: He is alert and oriented to person, place, and time.  Skin: Skin is warm and dry.  Psychiatric: He has a normal mood and affect.   Filed Vitals:   11/15/13 1344  BP: 150/90  Pulse: 65  Temp: 99.5 F (37.5 C)  TempSrc: Oral  Resp: 16  Height: 5' 10.75" (1.797 m)  Weight: 183 lb 6.4 oz (83.19 kg)  SpO2: 97%   Results for orders placed in visit on 11/15/13  POCT UA -  MICROSCOPIC ONLY      Result Value Range   WBC, Ur, HPF, POC neg     RBC, urine, microscopic neg     Bacteria, U Microscopic neg     Mucus, UA neg     Epithelial cells, urine per micros neg     Crystals, Ur, HPF, POC neg     Casts, Ur, LPF, POC neg     Yeast, UA neg    POCT URINALYSIS DIPSTICK      Result Value Range   Color, UA yellow     Clarity, UA clear     Glucose, UA neg     Bilirubin, UA neg     Ketones, UA neg     Spec Grav, UA 1.020     Blood, UA trace-lysed     pH, UA 7.0     Protein, UA neg     Urobilinogen, UA 0.2     Nitrite, UA neg     Leukocytes, UA Negative         Assessment & Plan:   Kelly Sanders is a 53 y.o. male Hematuria - Plan: POCT UA - Microscopic Only, POCT urinalysis dipstick - not seen on micro U/a today. Recheck at next CPE. Sooner if new urinary sx's.   HTN (hypertension) - borderline elevated today, now on NSAID for hip pain - monitor at home and if remains elevated, may need change in cozaar dose, especially if persistent NSAID use.    Patient Instructions  Keep a record of your blood pressures outside of the office and if remain elevated over next 2 weeks - call me to discuss options.   No blood seen on microscopiuc urine test today - recheck at next physical. If present then, could consider urology evaluation.  Return to the clinic or go to the nearest emergency room if any of your symptoms worsen or new symptoms occur.

## 2013-11-23 ENCOUNTER — Telehealth: Payer: Self-pay | Admitting: *Deleted

## 2013-11-23 ENCOUNTER — Ambulatory Visit (INDEPENDENT_AMBULATORY_CARE_PROVIDER_SITE_OTHER): Payer: 59 | Admitting: Internal Medicine

## 2013-11-23 ENCOUNTER — Encounter: Payer: Self-pay | Admitting: Internal Medicine

## 2013-11-23 VITALS — BP 124/88 | HR 80 | Ht 70.5 in | Wt 184.0 lb

## 2013-11-23 DIAGNOSIS — R195 Other fecal abnormalities: Secondary | ICD-10-CM

## 2013-11-23 DIAGNOSIS — R1031 Right lower quadrant pain: Secondary | ICD-10-CM | POA: Insufficient documentation

## 2013-11-23 HISTORY — DX: Other fecal abnormalities: R19.5

## 2013-11-23 MED ORDER — NA SULFATE-K SULFATE-MG SULF 17.5-3.13-1.6 GM/177ML PO SOLN
ORAL | Status: DC
Start: 2013-11-23 — End: 2013-11-29

## 2013-11-23 NOTE — Assessment & Plan Note (Signed)
Evaluate with colonoscopy. Note that this is hemoglobin specific for the colon, or rectum. It could be hemorrhoidal in origin but colonoscopy is appropriate. Possibilities include colorectal neoplasia, AVMs, inflammatory bowel disease, hemorrhoids as mentioned.The risks and benefits as well as alternatives of endoscopic procedure(s) have been discussed and reviewed. All questions answered. The patient agrees to proceed.

## 2013-11-23 NOTE — Patient Instructions (Signed)

## 2013-11-23 NOTE — Assessment & Plan Note (Signed)
Episodic over the last year or so infrequent cause not clear. At colonoscopy will want to try to look into the terminal ileum. Laboratory bowel disease is a possibility, could just be spasm/IBS-like pain though he does not have a chronic IBS type pattern. Seems to low for the gallbladder and not typical for that.

## 2013-11-23 NOTE — Progress Notes (Signed)
Subjective:  Referred by: Shade FloodGreene, Jeffrey R, MD   Patient ID: Kelly Sanders, male    DOB: July 10, 1961, 53 y.o.   MRN: [id number]  HPI The patient is a 53 year old married man here because he was found to have a Hemoccult-positive stool, immune based testing, on rectal exam in the past few months. That is in the setting of a 3 or 4 episodes history of sharp and crampy right lower quadrant pain over the last year and a half. It can last anywhere from a day to 5 days without clear precipitating or change in bowels. It does not radiate into the back. He does believe he has hemorrhoids as he has had 2 or 3 episodes a swelling irritation over the last year and one episode of bright red blood per rectum on the toilet paper and in the commode. The last problem was about 7 or 8 months ago I think that was the bleeding episode. He has arthritis problems in his back and his left hip, subsequent to the heme positive stool he has been started on indomethacin and is due to followup with Dr. Darrelyn HillockGioffre. His GI review of systems is otherwise negative Allergies  Allergen Reactions  . Biaxin [Clarithromycin] Rash  . Penicillins Rash  . Sulfa Antibiotics Rash   Outpatient Prescriptions Prior to Visit  Medication Sig Dispense Refill  . clonazePAM (KLONOPIN) 0.5 MG tablet Take 0.5-1 tablets (0.25-0.5 mg total) by mouth 2 (two) times daily as needed for anxiety.  30 tablet  0  . indomethacin (INDOCIN) 25 MG capsule Take 25 mg by mouth 2 (two) times daily with a meal.      . losartan (COZAAR) 50 MG tablet Take 0.5 tablets (25 mg total) by mouth daily.  90 tablet  1   No facility-administered medications prior to visit.   Past Medical History  Diagnosis Date  . Anxiety   . Hypertension   . Hyperlipidemia    Past Surgical History  Procedure Laterality Date  . Shoulder arthroscopy      Bi-lateral   History   Social History  . Marital Status: Single    Spouse Name: N/A    Number of Children: N/A    . Years of Education: N/A   Occupational History  . System Analyst    Social History Main Topics  . Smoking status: Never Smoker   . Smokeless tobacco: None  . Alcohol Use: No  . Drug Use: No  . Sexual Activity: None   Other Topics Concern  . None   Social History Narrative   Married. 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 Cancer Mother   . Hypertension Mother   . Cancer Father   . Colon cancer Neg Hx   . Prostate cancer Neg Hx    Review of Systems This is positive for osteoarthritis and back pain as mentioned above, all other review of systems negative    Objective:   Physical Exam General:  Well-developed, well-nourished and in no acute distress Eyes:  anicteric. ENT:   Mouth and posterior pharynx free of lesions.  Neck:   supple w/o thyromegaly or mass.  Lungs: Clear to auscultation bilaterally. Heart:  S1S2, no rubs, murmurs, gallops. Abdomen:  soft, non-tender, no hepatosplenomegaly, hernia, or mass and BS+.  Rectal: Deferred  until colonoscopy Lymph:  no cervical or supraclavicular adenopathy. Neuro:  A&O x 3.  Psych:  appropriate mood and  Affect.   Data Reviewed: Lab Results  Component Value Date   WBC 5.2 10/04/2013   HGB 15.6 10/04/2013   HCT 43.7 10/04/2013   MCV 91.8 10/04/2013   PLT 266 10/04/2013    I FOBT positive  Primary care notes from late 2014    Assessment & Plan:   1. Heme + stool   2. RLQ abdominal pain    I appreciate the opportunity to care for this patient. CC: Shade Flood, MD

## 2013-11-29 ENCOUNTER — Encounter: Payer: Self-pay | Admitting: Internal Medicine

## 2013-11-29 ENCOUNTER — Ambulatory Visit (AMBULATORY_SURGERY_CENTER): Payer: 59 | Admitting: Internal Medicine

## 2013-11-29 VITALS — BP 128/80 | HR 76 | Temp 97.0°F | Resp 19 | Ht 70.5 in | Wt 184.0 lb

## 2013-11-29 DIAGNOSIS — R195 Other fecal abnormalities: Secondary | ICD-10-CM

## 2013-11-29 MED ORDER — SODIUM CHLORIDE 0.9 % IV SOLN: 500.0000 mL | INTRAVENOUS | Status: DC

## 2013-11-29 NOTE — Progress Notes (Signed)
A/ox3 pleased with MAC, report to Penny RN 

## 2013-11-29 NOTE — Op Note (Signed)
[facility] [address] [place], [id number]   COLONOSCOPY PROCEDURE REPORT  PATIENT: Kelly KeyBingham, Letcher C.  MR#: [id number] BIRTHDATE: 12/27/1960 , 52  yrs. old GENDER: Male ENDOSCOPIST: Iva Booparl E Nadiya Pieratt, MD, [facility] REFERRED AO:ZHYQMVHBY:Jeffrey Neva SeatGreene, M.D. PROCEDURE DATE:  11/29/2013 PROCEDURE:   Colonoscopy, diagnostic First Screening Colonoscopy - Avg.  risk and is 50 yrs.  old or older - No.  Prior Negative Screening - Now for repeat screening. N/A  History of Adenoma - Now for follow-up colonoscopy & has been > or = to 3 yrs.  N/A  Polyps Removed Today? No.  Recommend repeat exam, <10 yrs? No. ASA CLASS:   Class II INDICATIONS:heme-positive stool. MEDICATIONS: propofol (Diprivan) 200mg  IV, MAC sedation, administered by CRNA, and These medications were titrated to patient response per physician's verbal order  DESCRIPTION OF PROCEDURE:   After the risks benefits and alternatives of the procedure were thoroughly explained, informed consent was obtained.  A digital rectal exam revealed no abnormalities of the rectum, A digital rectal exam revealed no prostatic nodules, and A digital rectal exam revealed the prostate was not enlarged.   The LB [id number]  endoscope was introduced through the anus and advanced to the cecum, which was identified by both the appendix and ileocecal valve. No adverse events experienced.   The quality of the prep was excellent using Suprep  The instrument was then slowly withdrawn as the colon was fully examined.      COLON FINDINGS: A normal appearing cecum, ileocecal valve, and appendiceal orifice were identified.  The ascending, hepatic flexure, transverse, splenic flexure, descending, sigmoid colon and rectum appeared unremarkable.  No polyps or cancers were seen.   A right colon retroflexion was performed.  Retroflexed views revealed no abnormalities. The time to cecum=1 minutes 55 seconds. Withdrawal time=7 minutes 29 seconds.   The scope was withdrawn and the procedure completed. COMPLICATIONS: There were no complications.  ENDOSCOPIC IMPRESSION: Normal colonoscopy - excellent prep  RECOMMENDATIONS: Repeat colonoscopy 10 years.   eSigned:  Iva Booparl E Ritchard Paragas, MD, [facility] 11/29/2013 3:12 PM   cc: Meredith StaggersJeffrey Greene, MD and The Patient

## 2013-11-29 NOTE — Patient Instructions (Signed)
YOU HAD AN ENDOSCOPIC PROCEDURE TODAY AT THE [facility]: Refer to the procedure report that was given to you for any specific questions about what was found during the examination.  If the procedure report does not answer your questions, please call your gastroenterologist to clarify.  If you requested that your care partner not be given the details of your procedure findings, then the procedure report has been included in a sealed envelope for you to review at your convenience later.  YOU SHOULD EXPECT: Some feelings of bloating in the abdomen. Passage of more gas than usual.  Walking can help get rid of the air that was put into your GI tract during the procedure and reduce the bloating. If you had a lower endoscopy (such as a colonoscopy or flexible sigmoidoscopy) you may notice spotting of blood in your stool or on the toilet paper. If you underwent a bowel prep for your procedure, then you may not have a normal bowel movement for a few days.  DIET: Your first meal following the procedure should be a light meal and then it is ok to progress to your normal diet.  A half-sandwich or bowl of soup is an example of a good first meal.  Heavy or fried foods are harder to digest and may make you feel nauseous or bloated.  Likewise meals heavy in dairy and vegetables can cause extra gas to form and this can also increase the bloating.  Drink plenty of fluids but you should avoid alcoholic beverages for 24 hours.  ACTIVITY: Your care partner should take you home directly after the procedure.  You should plan to take it easy, moving slowly for the rest of the day.  You can resume normal activity the day after the procedure however you should NOT DRIVE or use heavy machinery for 24 hours (because of the sedation medicines used during the test).    SYMPTOMS TO REPORT IMMEDIATELY: A gastroenterologist can be reached at any hour.  During normal business hours, 8:30 AM to 5:00 PM Monday through Friday,  call [phone].  After hours and on weekends, please call the GI answering service at [phone] who will take a message and have the physician on call contact you.   Following lower endoscopy (colonoscopy or flexible sigmoidoscopy):  Excessive amounts of blood in the stool  Significant tenderness or worsening of abdominal pains  Swelling of the abdomen that is new, acute  Fever of 100F or higher  Following upper endoscopy (EGD)  Vomiting of blood or coffee ground material  New chest pain or pain under the shoulder blades  Painful or persistently difficult swallowing  New shortness of breath  Fever of 100F or higher  Black, tarry-looking stools  FOLLOW UP: If any biopsies were taken you will be contacted by phone or by letter within the next 1-3 weeks.  Call your gastroenterologist if you have not heard about the biopsies in 3 weeks.  Our staff will call the home number listed on your records the next business day following your procedure to check on you and address any questions or concerns that you may have at that time regarding the information given to you following your procedure. This is a courtesy call and so if there is no answer at the home number and we have not heard from you through the emergency physician on call, we will assume that you have returned to your regular daily activities without incident.  SIGNATURES/CONFIDENTIALITY: You and/or your care   partner have signed paperwork which will be entered into your electronic medical record.  These signatures attest to the fact that that the information above on your After Visit Summary has been reviewed and is understood.  Full responsibility of the confidentiality of this discharge information lies with you and/or your care-partner.  

## 2013-11-30 ENCOUNTER — Telehealth: Payer: Self-pay

## 2013-11-30 NOTE — Telephone Encounter (Signed)
  Follow up Call-  Call back number 11/29/2013  Post procedure Call Back phone  # [phone] cell  Permission to leave phone message Yes     Patient questions:  Do you have a fever, pain , or abdominal swelling? no Pain Score  0 *  Have you tolerated food without any problems? yes  Have you been able to return to your normal activities? yes  Do you have any questions about your discharge instructions: Diet   no Medications  no Follow up visit  no  Do you have questions or concerns about your Care? no  Actions: * If pain score is 4 or above: No action needed, pain <4.

## 2014-01-15 ENCOUNTER — Other Ambulatory Visit: Payer: Self-pay | Admitting: Family Medicine

## 2014-01-15 DIAGNOSIS — F411 Generalized anxiety disorder: Secondary | ICD-10-CM

## 2014-01-18 MED ORDER — CLONAZEPAM 0.5 MG PO TABS: 0.2500 mg | ORAL_TABLET | Freq: Two times a day (BID) | ORAL | Status: DC | PRN

## 2014-01-18 NOTE — Telephone Encounter (Signed)
faxed

## 2014-03-01 NOTE — Telephone Encounter (Signed)
NA

## 2014-09-09 ENCOUNTER — Other Ambulatory Visit: Payer: Self-pay

## 2014-09-09 DIAGNOSIS — F411 Generalized anxiety disorder: Secondary | ICD-10-CM

## 2014-09-09 MED ORDER — CLONAZEPAM 0.5 MG PO TABS: 0.2500 mg | ORAL_TABLET | Freq: Two times a day (BID) | ORAL | Status: DC | PRN

## 2014-09-09 NOTE — Telephone Encounter (Signed)
Done, ready for pickup.

## 2014-09-09 NOTE — Telephone Encounter (Signed)
Pharm reqs RF of clonazepam. Looks like pt is overdue for f/up, but he does have appt sch for 12/18 w/Dr Conley RollsLe.

## 2014-09-12 NOTE — Telephone Encounter (Signed)
Faxed

## 2014-09-16 ENCOUNTER — Telehealth: Payer: Self-pay | Admitting: Family Medicine

## 2014-09-16 NOTE — Telephone Encounter (Signed)
I received form for preoperative clearance for left total hip to be done in January with Dr. Charlann Boxerlin. I have not seen Mr. Gentry FitzBingham recently, but it appears he has an appointment scheduled with Dr. Conley RollsLe in December. Depending on reason for that appointment, can discuss preop clearance then, or return to see me by appointment or walk in before end of year to review the surgery and preop clearance.  Copied  Dr. Conley RollsLe on this note. Will leave preop clearance form in my chartbox depending on who completes this. Thanks.

## 2014-10-21 ENCOUNTER — Encounter: Payer: Self-pay | Admitting: Family Medicine

## 2014-10-21 ENCOUNTER — Ambulatory Visit (INDEPENDENT_AMBULATORY_CARE_PROVIDER_SITE_OTHER): Payer: 59 | Admitting: Family Medicine

## 2014-10-21 VITALS — BP 130/90 | HR 88 | Temp 98.8°F | Resp 16 | Ht 71.0 in | Wt 173.0 lb

## 2014-10-21 DIAGNOSIS — Z125 Encounter for screening for malignant neoplasm of prostate: Secondary | ICD-10-CM

## 2014-10-21 DIAGNOSIS — I10 Essential (primary) hypertension: Secondary | ICD-10-CM

## 2014-10-21 DIAGNOSIS — Z13220 Encounter for screening for lipoid disorders: Secondary | ICD-10-CM

## 2014-10-21 DIAGNOSIS — Z0000 Encounter for general adult medical examination without abnormal findings: Secondary | ICD-10-CM

## 2014-10-21 DIAGNOSIS — Z1329 Encounter for screening for other suspected endocrine disorder: Secondary | ICD-10-CM

## 2014-10-21 DIAGNOSIS — Z01818 Encounter for other preprocedural examination: Secondary | ICD-10-CM

## 2014-10-21 DIAGNOSIS — M1612 Unilateral primary osteoarthritis, left hip: Secondary | ICD-10-CM

## 2014-10-21 DIAGNOSIS — F411 Generalized anxiety disorder: Secondary | ICD-10-CM

## 2014-10-21 DIAGNOSIS — Z2821 Immunization not carried out because of patient refusal: Secondary | ICD-10-CM

## 2014-10-21 DIAGNOSIS — Z87448 Personal history of other diseases of urinary system: Secondary | ICD-10-CM

## 2014-10-21 LAB — COMPLETE METABOLIC PANEL WITH GFR
AST: 19 U/L (ref 0–37)
Albumin: 4.1 g/dL (ref 3.5–5.2)
Alkaline Phosphatase: 84 U/L (ref 39–117)
CO2: 27 mEq/L (ref 19–32)
Chloride: 105 mEq/L (ref 96–112)
Creat: 1.11 mg/dL (ref 0.50–1.35)
GFR, Est Non African American: 76 mL/min
Glucose, Bld: 94 mg/dL (ref 70–99)

## 2014-10-21 LAB — CBC
HCT: 43.8 % (ref 39.0–52.0)
Hemoglobin: 15.3 g/dL (ref 13.0–17.0)
MCH: 32.4 pg (ref 26.0–34.0)
MCHC: 34.9 g/dL (ref 30.0–36.0)
MCV: 92.8 fL (ref 78.0–100.0)
MPV: 9 fL — ABNORMAL LOW (ref 9.4–12.4)
Platelets: 258 10*3/uL (ref 150–400)
RBC: 4.72 MIL/uL (ref 4.22–5.81)
RDW: 11.9 % (ref 11.5–15.5)
WBC: 5.1 10*3/uL (ref 4.0–10.5)

## 2014-10-21 LAB — COMPLETE METABOLIC PANEL WITHOUT GFR
ALT: 32 U/L (ref 0–53)
BUN: 17 mg/dL (ref 6–23)
Calcium: 9.6 mg/dL (ref 8.4–10.5)
GFR, Est African American: 88 mL/min
Potassium: 4.6 meq/L (ref 3.5–5.3)
Sodium: 142 meq/L (ref 135–145)
Total Bilirubin: 0.6 mg/dL (ref 0.2–1.2)
Total Protein: 6.6 g/dL (ref 6.0–8.3)

## 2014-10-21 LAB — POCT UA - MICROSCOPIC ONLY
Bacteria, U Microscopic: NEGATIVE
Casts, Ur, LPF, POC: NEGATIVE
Crystals, Ur, HPF, POC: NEGATIVE
Mucus, UA: NEGATIVE
Yeast, UA: NEGATIVE

## 2014-10-21 LAB — TSH: TSH: 1.101 u[IU]/mL (ref 0.350–4.500)

## 2014-10-21 LAB — POCT URINALYSIS DIPSTICK
Bilirubin, UA: NEGATIVE
Glucose, UA: NEGATIVE
Ketones, UA: NEGATIVE
Leukocytes, UA: NEGATIVE
Nitrite, UA: NEGATIVE
Protein, UA: NEGATIVE
Spec Grav, UA: 1.02
Urobilinogen, UA: 0.2
pH, UA: 5

## 2014-10-21 LAB — LIPID PANEL
Cholesterol: 226 mg/dL — ABNORMAL HIGH (ref 0–200)
HDL: 61 mg/dL (ref >39)
LDL Cholesterol: 152 mg/dL — ABNORMAL HIGH (ref 0–99)
Total CHOL/HDL Ratio: 3.7 ratio
Triglycerides: 64 mg/dL (ref <150)
VLDL: 13 mg/dL (ref 0–40)

## 2014-10-21 MED ORDER — CLONAZEPAM 0.5 MG PO TABS: 0.2500 mg | ORAL_TABLET | Freq: Two times a day (BID) | ORAL | Status: DC | PRN

## 2014-10-21 MED ORDER — LOSARTAN POTASSIUM 50 MG PO TABS: 25.0000 mg | ORAL_TABLET | Freq: Every day | ORAL | Status: DC

## 2014-10-21 NOTE — Progress Notes (Signed)
 Chief Complaint:  Chief Complaint  Patient presents with  . Annual Exam    HPI: Kelly Sanders is a 53 y.o. male who is here for annual visit and also pre-op eval for left hip surgery due to arthritis.  He has had colonoscopy 11/2013 UTD, Dr  Leone PayorGessner He is UTD on Tdap 2014 He had hemorrhoid a couple of months ago, no hematuria or melena or current issues.  No CP or SOB or palpitations. No OSA, no DM.  HTN-BP runs 130s/80s at home but  Is aways higher here. He has no dizziness, no SEs, was on ACEI and had dry cough Left hip pain for 2 years, he cannot bend over to tue shoes lace, he has a limp from it.  He has diet controled hyperlipidemia He takes klonopin prn for anxiety, it helps a lot. Has been out for quite some time.    Past Medical History  Diagnosis Date  . Anxiety   . Hypertension   . Hyperlipidemia   . Arthritis     osteoarthritis   Past Surgical History  Procedure Laterality Date  . Shoulder arthroscopy      Bi-lateral  . Left hand fibroid tumor removal     History   Social History  . Marital Status: Single    Spouse Name: N/A    Number of Children: N/A  . Years of Education: N/A   Occupational History  . System Analyst    Social History Main Topics  . Smoking status: Never Smoker   . Smokeless tobacco: None  . Alcohol Use: No  . Drug Use: No  . Sexual Activity: None   Other Topics Concern  . None   Social History Narrative   Married. 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 Cancer Mother   . Hypertension Mother   . Cancer Father   . Colon cancer Neg Hx   . Prostate cancer Neg Hx   . Esophageal cancer Neg Hx   . Rectal cancer Neg Hx   . Stomach cancer Neg Hx    Allergies  Allergen Reactions  . Biaxin [Clarithromycin] Rash  . Penicillins Rash  . Sulfa Antibiotics Rash    Prior to Admission medications   Medication Sig Start Date End Date Taking? Authorizing Provider  clonazePAM (KLONOPIN) 0.5 MG tablet Take 0.5-1 tablets (0.25-0.5 mg total) by mouth 2 (two) times daily as needed for anxiety. 09/09/14  Yes Shade FloodJeffrey R Greene, MD  losartan (COZAAR) 50 MG tablet Take 0.5 tablets (25 mg total) by mouth daily. 10/04/13  Yes Shade FloodJeffrey R Greene, MD  indomethacin (INDOCIN) 25 MG capsule Take 25 mg by mouth 2 (two) times daily with a meal.    Historical Provider, MD     ROS: The patient denies fevers, chills, night sweats, unintentional weight loss, chest pain, palpitations, wheezing, dyspnea on exertion, nausea, vomiting, abdominal pain, dysuria, hematuria, melena, numbness, weakness, or tingling.  All other systems have been reviewed and were otherwise negative with the exception of those mentioned in the HPI and as above.    PHYSICAL EXAM: Filed Vitals:   10/21/14 0927  BP: 130/90  Pulse: 88  Temp: 98.8 F (37.1 C)  Resp: 16   Filed Vitals:   10/21/14 0927  Height: 5\' 11"  (1.803 m)  Weight: 173 lb (78.472  kg)   Body mass index is 24.14 kg/(m^2).  General: Alert, no acute distress HEENT:  Normocephalic, atraumatic, oropharynx patent. EOMI, PERRLA. fundo exam nl, Tm nl Cardiovascular:  Regular rate and rhythm, no rubs murmurs or gallops.  No Carotid bruits, radial pulse intact. No pedal edema.  Respiratory: Clear to auscultation bilaterally.  No wheezes, rales, or rhonchi.  No cyanosis, no use of accessory musculature GI: No organomegaly, abdomen is soft and non-tender, positive bowel sounds.  No masses. Skin: No rashes. Neurologic: Facial musculature symmetric. Psychiatric: Patient is appropriate throughout our interaction. Lymphatic: No cervical lymphadenopathy Musculoskeletal: Gait intact. Genital exam-nl testicles, neg hernia Rectal exam-no masses, prostate nl size   LABS: Results for orders placed or performed in visit on 11/15/13  POCT UA -  Microscopic Only  Result Value Ref Range   WBC, Ur, HPF, POC neg    RBC, urine, microscopic neg    Bacteria, U Microscopic neg    Mucus, UA neg    Epithelial cells, urine per micros neg    Crystals, Ur, HPF, POC neg    Casts, Ur, LPF, POC neg    Yeast, UA neg   POCT urinalysis dipstick  Result Value Ref Range   Color, UA yellow    Clarity, UA clear    Glucose, UA neg    Bilirubin, UA neg    Ketones, UA neg    Spec Grav, UA 1.020    Blood, UA trace-lysed    pH, UA 7.0    Protein, UA neg    Urobilinogen, UA 0.2    Nitrite, UA neg    Leukocytes, UA Negative      EKG/XRAY:   Primary read [facility]   ASSESSMENT/PLAN: Encounter Diagnoses  Name Primary?  . Annual physical exam Yes  . Primary osteoarthritis of left hip   . Essential hypertension   . Screening for hyperlipidemia   . Screening for thyroid disorder   . History of hematuria   . Influenza vaccination declined   . Screening for prostate cancer    53 y/o male with HTN on low dose losartan, doing well. No Complaints. Pending hip surgery with Dr Charlann Boxerlin for hip arthritis  Annual labs pending He was advised to monitor his BP and if > 150/90 then can take 1 tab of losartan rather than just 1/2 tab. He will let me know in next 1-2 weeks.  EKG looks WNL  Fu prn, otherwise in 6 months    Gross sideeffects, risk and benefits, and alternatives of medications d/w patient. Patient is aware that all medications have potential sideeffects and we are unable to predict every sideeffect or drug-drug interaction that may occur.  ,  PHUONG, DO 10/21/2014 10:00 AM   10/25/14-spoke with patient about labs, he has micorscopic hematuria, otherwise all other labs are  WNL . Cont with current plan, will recheck urine during pre-op and if still has hematuria then need to get nonurgent urology follow-up. Never smoked, he has never worked with chemicals.

## 2014-10-22 LAB — PSA: PSA: 0.29 ng/mL (ref <=4.00)

## 2014-10-31 ENCOUNTER — Encounter: Payer: Self-pay | Admitting: Family Medicine

## 2014-11-01 ENCOUNTER — Encounter: Payer: Self-pay | Admitting: Family Medicine

## 2014-11-01 NOTE — Telephone Encounter (Signed)
Called and left a VM with medical records at [facility] to see if they have the form; if not I told them to fax another one over.

## 2014-11-01 NOTE — Telephone Encounter (Signed)
-----   Message from Lenell Antuhao P Le, DO sent at 11/01/2014  9:55 AM EST ----- Regarding: [facility] surgical clearance form Hi there!   I filled out a srugical clearance form for Mr Kelly Sanders about 1.5 weeks ago and it was faxed and also scanned but the document is not to be found on epic.   He states [facility] has not received it and so I need you to call them to recheck and if they have not received it then see if they can fax another form to us so I can sign it again.   His surgery is on 11/15/14 I believe.  Thanks, Dr Conley RollsLe

## 2014-11-08 ENCOUNTER — Ambulatory Visit (HOSPITAL_COMMUNITY)
Admission: RE | Admit: 2014-11-08 | Discharge: 2014-11-08 | Disposition: A | Discharge status: Home / Self Care | Payer: 59 | Source: Ambulatory Visit | Attending: Anesthesiology | Admitting: Anesthesiology

## 2014-11-08 ENCOUNTER — Encounter (HOSPITAL_COMMUNITY): Payer: Self-pay

## 2014-11-08 ENCOUNTER — Encounter (HOSPITAL_COMMUNITY)
Admission: RE | Admit: 2014-11-08 | Discharge: 2014-11-08 | Disposition: A | Discharge status: Home / Self Care | Payer: 59 | Source: Ambulatory Visit | Attending: Orthopedic Surgery | Admitting: Orthopedic Surgery

## 2014-11-08 DIAGNOSIS — R05 Cough: Secondary | ICD-10-CM | POA: Insufficient documentation

## 2014-11-08 DIAGNOSIS — R918 Other nonspecific abnormal finding of lung field: Secondary | ICD-10-CM | POA: Diagnosis not present

## 2014-11-08 DIAGNOSIS — Z01818 Encounter for other preprocedural examination: Secondary | ICD-10-CM | POA: Diagnosis not present

## 2014-11-08 DIAGNOSIS — I10 Essential (primary) hypertension: Secondary | ICD-10-CM

## 2014-11-08 HISTORY — DX: Other specified postprocedural states: Z98.890

## 2014-11-08 HISTORY — DX: Nausea with vomiting, unspecified: R11.2

## 2014-11-08 HISTORY — DX: Other complications of anesthesia, initial encounter: T88.59XA

## 2014-11-08 HISTORY — DX: Adverse effect of unspecified anesthetic, initial encounter: T41.45XA

## 2014-11-08 LAB — URINALYSIS, ROUTINE W REFLEX MICROSCOPIC
Bilirubin Urine: NEGATIVE
Glucose, UA: NEGATIVE mg/dL
Hgb urine dipstick: NEGATIVE
KETONES UR: NEGATIVE mg/dL
LEUKOCYTES UA: NEGATIVE
Nitrite: NEGATIVE
Protein, ur: NEGATIVE mg/dL
Specific Gravity, Urine: 1.02 (ref 1.005–1.030)
UROBILINOGEN UA: 0.2 mg/dL (ref 0.0–1.0)
pH: 6.5 (ref 5.0–8.0)

## 2014-11-08 LAB — SURGICAL PCR SCREEN
MRSA, PCR: NEGATIVE
STAPHYLOCOCCUS AUREUS: NEGATIVE

## 2014-11-08 LAB — APTT: APTT: 31 s (ref 24–37)

## 2014-11-08 LAB — PROTIME-INR
INR: 0.98 (ref 0.00–1.49)
Prothrombin Time: 13.1 seconds (ref 11.6–15.2)

## 2014-11-08 LAB — ABO/RH: ABO/RH(D): A NEG

## 2014-11-08 NOTE — Patient Instructions (Signed)
[address] Kelly KeyWilliam C Sanders  11/08/2014   Your procedure is scheduled on:     Tuesday, November 15, 2014  Report to [facility] and follow signs to  [facility] arrive at 0515 AM.   Call this number if you have problems the morning of surgery [phone] or Presurgical Testing [phone].   Remember:  Do not eat food or drink liquids :After Midnight.   Take these medicines the morning of surgery with A SIP OF WATER: Clonazepam if needed                               You may not have any metal on your body including hair pins and piercings  Do not wear jewelry, lotions, powders, colognes or deodorant.  Men may shave face and neck.               Do not bring valuables to the hospital.  IS NOT RESPONSIBLE FOR VALUABLES.  Contacts, dentures or bridgework may not be worn into surgery.    Patients discharged the day of surgery will not be allowed to drive home.  Name and phone number of your driver:Kelly Sanders (friend)   Special Instructions: review fact sheets for MRSA information, Blood Transfusion fact sheet, Incentive Spirometry.  Remember: Type/Screen "Blue armsbands"- may not be removed once applied(would result in being retested AM of surgery, if removed). ________________________________________________________________________  [facility] - Preparing for Surgery Before surgery, you can play an important role.  Because skin is not sterile, your skin needs to be as free of germs as possible.  You can reduce the number of germs on your skin by washing with CHG (chlorahexidine gluconate) soap before surgery.  CHG is an antiseptic cleaner which kills germs and bonds with the skin to continue killing germs even after washing. Please DO NOT use if you have an allergy to CHG or antibacterial soaps.  If your skin becomes reddened/irritated stop using the CHG and inform your nurse when you arrive at Short Stay. Do not shave (including legs and underarms) for  at least 48 hours prior to the first CHG shower.  You may shave your face/neck. Please follow these instructions carefully:  1.  Shower with CHG Soap the night before surgery and the  morning of Surgery.  2.  If you choose to wash your hair, wash your hair first as usual with your  normal  shampoo.  3.  After you shampoo, rinse your hair and body thoroughly to remove the  shampoo.                           4.  Use CHG as you would any other liquid soap.  You can apply chg directly  to the skin and wash                       Gently with a scrungie or clean washcloth.  5.  Apply the CHG Soap to your body ONLY FROM THE NECK DOWN.   Do not use on face/ open                           Wound or open sores. Avoid contact with eyes, ears mouth and genitals (private parts).  Wash face,  Genitals (private parts) with your normal soap.             6.  Wash thoroughly, paying special attention to the area where your surgery  will be performed.  7.  Thoroughly rinse your body with warm water from the neck down.  8.  DO NOT shower/wash with your normal soap after using and rinsing off  the CHG Soap.                9.  Pat yourself dry with a clean towel.            10.  Wear clean pajamas.            11.  Place clean sheets on your bed the night of your first shower and do not  sleep with pets. Day of Surgery : Do not apply any lotions/deodorants the morning of surgery.  Please wear clean clothes to the hospital/surgery center.  FAILURE TO FOLLOW THESE INSTRUCTIONS MAY RESULT IN THE CANCELLATION OF YOUR SURGERY PATIENT SIGNATURE_________________________________  NURSE SIGNATURE__________________________________  ________________________________________________________________________   Kelly Sanders  An incentive spirometer is a tool that can help keep your lungs clear and active. This tool measures how well you are filling your lungs with each breath. Taking long deep  breaths may help reverse or decrease the chance of developing breathing (pulmonary) problems (especially infection) following:  A long period of time when you are unable to move or be active. BEFORE THE PROCEDURE   If the spirometer includes an indicator to show your best effort, your nurse or respiratory therapist will set it to a desired goal.  If possible, sit up straight or lean slightly forward. Try not to slouch.  Hold the incentive spirometer in an upright position. INSTRUCTIONS FOR USE   Sit on the edge of your bed if possible, or sit up as far as you can in bed or on a chair.  Hold the incentive spirometer in an upright position.  Breathe out normally.  Place the mouthpiece in your mouth and seal your lips tightly around it.  Breathe in slowly and as deeply as possible, raising the piston or the ball toward the top of the column.  Hold your breath for 3-5 seconds or for as long as possible. Allow the piston or ball to fall to the bottom of the column.  Remove the mouthpiece from your mouth and breathe out normally.  Rest for a few seconds and repeat Steps 1 through 7 at least 10 times every 1-2 hours when you are awake. Take your time and take a few normal breaths between deep breaths.  The spirometer may include an indicator to show your best effort. Use the indicator as a goal to work toward during each repetition.  After each set of 10 deep breaths, practice coughing to be sure your lungs are clear. If you have an incision (the cut made at the time of surgery), support your incision when coughing by placing a pillow or rolled up towels firmly against it. Once you are able to get out of bed, walk around indoors and cough well. You may stop using the incentive spirometer when instructed by your caregiver.  RISKS AND COMPLICATIONS  Take your time so you do not get dizzy or light-headed.  If you are in pain, you may need to take or ask for pain medication before doing  incentive spirometry. It is harder to take a deep breath if you are having  pain. AFTER USE  Rest and breathe slowly and easily.  It can be helpful to keep track of a log of your progress. Your caregiver can provide you with a simple table to help with this. If you are using the spirometer at home, follow these instructions: SEEK MEDICAL CARE IF:   You are having difficultly using the spirometer.  You have trouble using the spirometer as often as instructed.  Your pain medication is not giving enough relief while using the spirometer.  You develop fever of 100.5 F (38.1 C) or higher. SEEK IMMEDIATE MEDICAL CARE IF:   You cough up bloody sputum that had not been present before.  You develop fever of 102 F (38.9 C) or greater.  You develop worsening pain at or near the incision site. MAKE SURE YOU:   Understand these instructions.  Will watch your condition.  Will get help right away if you are not doing well or get worse. Document Released: 03/03/2007 Document Revised: 01/13/2012 Document Reviewed: 05/04/2007 ExitCare Patient Information 2014 [employer], [place].   ________________________________________________________________________  WHAT IS A BLOOD TRANSFUSION? Blood Transfusion Information  A transfusion is the replacement of blood or some of its parts. Blood is made up of multiple cells which provide different functions.  Red blood cells carry oxygen and are used for blood loss replacement.  White blood cells fight against infection.  Platelets control bleeding.  Plasma helps clot blood.  Other blood products are available for specialized needs, such as hemophilia or other clotting disorders. BEFORE THE TRANSFUSION  Who gives blood for transfusions?   Healthy volunteers who are fully evaluated to make sure their blood is safe. This is blood bank blood. Transfusion therapy is the safest it has ever been in the practice of medicine. Before blood is taken from a  donor, a complete history is taken to make sure that person has no history of diseases nor engages in risky social behavior (examples are intravenous drug use or sexual activity with multiple partners). The donor's travel history is screened to minimize risk of transmitting infections, such as malaria. The donated blood is tested for signs of infectious diseases, such as HIV and hepatitis. The blood is then tested to be sure it is compatible with you in order to minimize the chance of a transfusion reaction. If you or a relative donates blood, this is often done in anticipation of surgery and is not appropriate for emergency situations. It takes many days to process the donated blood. RISKS AND COMPLICATIONS Although transfusion therapy is very safe and saves many lives, the main dangers of transfusion include:   Getting an infectious disease.  Developing a transfusion reaction. This is an allergic reaction to something in the blood you were given. Every precaution is taken to prevent this. The decision to have a blood transfusion has been considered carefully by your caregiver before blood is given. Blood is not given unless the benefits outweigh the risks. AFTER THE TRANSFUSION  Right after receiving a blood transfusion, you will usually feel much better and more energetic. This is especially true if your red blood cells have gotten low (anemic). The transfusion raises the level of the red blood cells which carry oxygen, and this usually causes an energy increase.  The nurse administering the transfusion will monitor you carefully for complications. HOME CARE INSTRUCTIONS  No special instructions are needed after a transfusion. You may find your energy is better. Speak with your caregiver about any limitations on activity for underlying diseases  you may have. SEEK MEDICAL CARE IF:   Your condition is not improving after your transfusion.  You develop redness or irritation at the intravenous (IV)  site. SEEK IMMEDIATE MEDICAL CARE IF:  Any of the following symptoms occur over the next 12 hours:  Shaking chills.  You have a temperature by mouth above 102 F (38.9 C), not controlled by medicine.  Chest, back, or muscle pain.  People around you feel you are not acting correctly or are confused.  Shortness of breath or difficulty breathing.  Dizziness and fainting.  You get a rash or develop hives.  You have a decrease in urine output.  Your urine turns a dark color or changes to pink, red, or brown. Any of the following symptoms occur over the next 10 days:  You have a temperature by mouth above 102 F (38.9 C), not controlled by medicine.  Shortness of breath.  Weakness after normal activity.  The white part of the eye turns yellow (jaundice).  You have a decrease in the amount of urine or are urinating less often.  Your urine turns a dark color or changes to pink, red, or brown. Document Released: 10/18/2000 Document Revised: 01/13/2012 Document Reviewed: 06/06/2008 [facility] Patient Information 2014 [place], [place].  _______________________________________________________________________

## 2014-11-08 NOTE — Progress Notes (Addendum)
Clearance note per Dr Conley RollsLe per chart 10/21/2014 EKG per epic 10/21/2014 10/21/2014 CBC and CMP in epic

## 2014-11-08 NOTE — Progress Notes (Signed)
Your patient has screened at an elevated risk for Obstructive Sleep Apnea using the Stop-Bang Tool during a pre-surgical vist. A score of 4 or greater is an elevated risk. Score of 5.  

## 2014-11-13 NOTE — H&P (Signed)
TOTAL HIP ADMISSION H&P  Patient is admitted for left total hip arthroplasty, anterior approach.  Subjective:  Chief Complaint:    Left hip primary OA / pain  HPI: Kelly Sanders, 54 y.o. male, has a history of pain and functional disability in the left hip(s) due to arthritis and patient has failed non-surgical conservative treatments for greater than 12 weeks to include NSAID's and/or analgesics and activity modification.  Onset of symptoms was gradual starting ~1.5 years ago with gradually worsening course since that time.The patient noted no past surgery on the left hip(s).  Patient currently rates pain in the left hip at 9 out of 10 with activity. Patient has night pain, worsening of pain with activity and weight bearing, trendelenberg gait, pain that interfers with activities of daily living and pain with passive range of motion. Patient has evidence of periarticular osteophytes and joint space narrowing by imaging studies. This condition presents safety issues increasing the risk of falls.  There is no current active infection.   Risks, benefits and expectations were discussed with the patient.  Risks including but not limited to the risk of anesthesia, blood clots, nerve damage, blood vessel damage, failure of the prosthesis, infection and up to and including death.  Patient understand the risks, benefits and expectations and wishes to proceed with surgery.   PCP: Shade FloodGREENE,JEFFREY R, MD  D/C Plans:      Home with HHPT (? same day)  Post-op Meds:       No Rx given  Tranexamic Acid:      To be given - IV   Decadron:      Is to be given  FYI:     ASA post-op   Norco post-op    Patient Active Problem List   Diagnosis Date Noted  . Heme + stool, iFOBT 11/23/2013  . RLQ abdominal pain 11/23/2013  . HTN (hypertension) 07/18/2012  . Anxiety 07/18/2012   Past Medical History  Diagnosis Date  . Anxiety   . Hypertension   . Hyperlipidemia   . Arthritis     osteoarthritis  .  Complication of anesthesia     slow to wake   . PONV (postoperative nausea and vomiting)     Past Surgical History  Procedure Laterality Date  . Shoulder arthroscopy      Bi-lateral  . Left hand fibroid tumor removal    . Colonscopy      January 2015     No prescriptions prior to admission   Allergies  Allergen Reactions  . Biaxin [Clarithromycin] Rash  . Penicillins Rash  . Sulfa Antibiotics Rash    History  Substance Use Topics  . Smoking status: Never Smoker   . Smokeless tobacco: Never Used  . Alcohol Use: No    Family History  Problem Relation Age of Onset  . Cancer Mother   . Hypertension Mother   . Cancer Father   . Colon cancer Neg Hx   . Prostate cancer Neg Hx   . Esophageal cancer Neg Hx   . Rectal cancer Neg Hx   . Stomach cancer Neg Hx      Review of Systems  Constitutional: Negative.   HENT: Negative.   Eyes: Negative.   Respiratory: Negative.   Cardiovascular: Negative.   Gastrointestinal: Negative.   Genitourinary: Negative.   Musculoskeletal: Positive for joint pain.  Skin: Negative.   Neurological: Negative.   Endo/Heme/Allergies: Negative.   Psychiatric/Behavioral: The patient is nervous/anxious.     Objective:  Physical Exam  Constitutional: He is oriented to person, place, and time. He appears well-developed and well-nourished.  HENT:  Head: Normocephalic and atraumatic.  Mouth/Throat: Oropharynx is clear and moist.  Eyes: Pupils are equal, round, and reactive to light.  Neck: Neck supple. No JVD present. No tracheal deviation present. No thyromegaly present.  Cardiovascular: Normal rate, regular rhythm, normal heart sounds and intact distal pulses.   Respiratory: Effort normal and breath sounds normal. No stridor. No respiratory distress. He has no wheezes.  GI: Soft. There is no tenderness. There is no guarding.  Musculoskeletal:       Left hip: He exhibits decreased range of motion, decreased strength, tenderness and bony  tenderness. He exhibits no swelling, no deformity and no laceration.  Lymphadenopathy:    He has no cervical adenopathy.  Neurological: He is alert and oriented to person, place, and time.  Skin: Skin is warm and dry.  Psychiatric: He has a normal mood and affect.      Labs:  Estimated body mass index is 26.02 kg/(m^2) as calculated from the following:   Height as of 11/29/13: 5' 10.5" (1.791 m).   Weight as of 11/29/13: 83.462 kg (184 lb).   Imaging Review Plain radiographs demonstrate severe degenerative joint disease of the left hip(s). The bone quality appears to be good for age and reported activity level.  Assessment/Plan:  End stage arthritis, left hip(s)  The patient history, physical examination, clinical judgement of the provider and imaging studies are consistent with end stage degenerative joint disease of the left hip(s) and total hip arthroplasty is deemed medically necessary. The treatment options including medical management, injection therapy, arthroscopy and arthroplasty were discussed at length. The risks and benefits of total hip arthroplasty were presented and reviewed. The risks due to aseptic loosening, infection, stiffness, dislocation/subluxation,  thromboembolic complications and other imponderables were discussed.  The patient acknowledged the explanation, agreed to proceed with the plan and consent was signed. Patient is being admitted for inpatient treatment for surgery, pain control, PT, OT, prophylactic antibiotics, VTE prophylaxis, progressive ambulation and ADL's and discharge planning.The patient is planning to be discharged home with home health services.    Anastasio Auerbach Kelly Knippenberg   PA-C  11/13/2014, 8:35 PM

## 2014-11-14 ENCOUNTER — Encounter (HOSPITAL_COMMUNITY): Payer: Self-pay | Admitting: Anesthesiology

## 2014-11-14 NOTE — Anesthesia Preprocedure Evaluation (Addendum)
Anesthesia Evaluation  Patient identified by MRN, date of birth, ID band Patient awake    Reviewed: Allergy & Precautions, NPO status , Patient's Chart, lab work & pertinent test results  History of Anesthesia Complications (+) PONV and history of anesthetic complications  Airway Mallampati: II  TM Distance: >3 FB Neck ROM: Full    Dental no notable dental hx.    Pulmonary neg pulmonary ROS,  breath sounds clear to auscultation  Pulmonary exam normal       Cardiovascular Exercise Tolerance: Good hypertension, Pt. on medications Rhythm:Regular Rate:Normal  CXR and ECG normal   Neuro/Psych Anxiety negative neurological ROS     GI/Hepatic negative GI ROS, Neg liver ROS,   Endo/Other  negative endocrine ROS  Renal/GU negative Renal ROS  negative genitourinary   Musculoskeletal  (+) Arthritis -,   Abdominal   Peds negative pediatric ROS (+)  Hematology negative hematology ROS (+)   Anesthesia Other Findings   Reproductive/Obstetrics negative OB ROS                           Anesthesia Physical Anesthesia Plan  ASA: II  Anesthesia Plan: Spinal   Post-op Pain Management:    Induction: Intravenous  Airway Management Planned:   Additional Equipment:   Intra-op Plan:   Post-operative Plan: Extubation in OR  Informed Consent: I have reviewed the patients History and Physical, chart, labs and discussed the procedure including the risks, benefits and alternatives for the proposed anesthesia with the patient or authorized representative who has indicated his/her understanding and acceptance.   Dental advisory given  Plan Discussed with: CRNA  Anesthesia Plan Comments: (Discussed spinal and general. Discussed risks/benefits of spinal including headache, backache, failure, bleeding, infection, and nerve damage. Patient consents to spinal. Questions answered. Coagulation studies and  platelet count acceptable.)       Anesthesia Quick Evaluation

## 2014-11-15 ENCOUNTER — Ambulatory Visit (HOSPITAL_COMMUNITY): Payer: 59 | Admitting: Anesthesiology

## 2014-11-15 ENCOUNTER — Ambulatory Visit (HOSPITAL_COMMUNITY): Payer: 59

## 2014-11-15 ENCOUNTER — Ambulatory Visit (HOSPITAL_COMMUNITY)
Admission: RE | Admit: 2014-11-15 | Discharge: 2014-11-15 | Disposition: A | Discharge status: Home / Self Care | Payer: 59 | Source: Ambulatory Visit | Attending: Orthopedic Surgery | Admitting: Orthopedic Surgery

## 2014-11-15 ENCOUNTER — Encounter (HOSPITAL_COMMUNITY)
Admission: RE | Disposition: A | Discharge status: Home / Self Care | Payer: Self-pay | Source: Ambulatory Visit | Attending: Orthopedic Surgery

## 2014-11-15 ENCOUNTER — Encounter (HOSPITAL_COMMUNITY): Payer: Self-pay | Admitting: *Deleted

## 2014-11-15 DIAGNOSIS — M1612 Unilateral primary osteoarthritis, left hip: Secondary | ICD-10-CM | POA: Insufficient documentation

## 2014-11-15 DIAGNOSIS — Z882 Allergy status to sulfonamides status: Secondary | ICD-10-CM | POA: Insufficient documentation

## 2014-11-15 DIAGNOSIS — F419 Anxiety disorder, unspecified: Secondary | ICD-10-CM | POA: Diagnosis not present

## 2014-11-15 DIAGNOSIS — I10 Essential (primary) hypertension: Secondary | ICD-10-CM | POA: Insufficient documentation

## 2014-11-15 DIAGNOSIS — Z880 Allergy status to penicillin: Secondary | ICD-10-CM | POA: Insufficient documentation

## 2014-11-15 DIAGNOSIS — Z96649 Presence of unspecified artificial hip joint: Secondary | ICD-10-CM

## 2014-11-15 DIAGNOSIS — Z888 Allergy status to other drugs, medicaments and biological substances status: Secondary | ICD-10-CM | POA: Diagnosis not present

## 2014-11-15 HISTORY — PX: TOTAL HIP ARTHROPLASTY: SHX124

## 2014-11-15 LAB — TYPE AND SCREEN
ABO/RH(D): A NEG
ANTIBODY SCREEN: NEGATIVE

## 2014-11-15 SURGERY — ARTHROPLASTY, HIP, TOTAL, ANTERIOR APPROACH
Anesthesia: Spinal | Site: Hip | Laterality: Left

## 2014-11-15 MED ORDER — HYDROMORPHONE HCL 1 MG/ML IJ SOLN
INTRAMUSCULAR | Status: AC
  Filled 2014-11-15: qty 1

## 2014-11-15 MED ORDER — CEFAZOLIN SODIUM-DEXTROSE 2-3 GM-% IV SOLR
2.0000 g | INTRAVENOUS | Status: AC
  Administered 2014-11-15: 2 g via INTRAVENOUS

## 2014-11-15 MED ORDER — FENTANYL CITRATE 0.05 MG/ML IJ SOLN
INTRAMUSCULAR | Status: DC | PRN
  Administered 2014-11-15 (×3): 50 ug via INTRAVENOUS

## 2014-11-15 MED ORDER — LIDOCAINE HCL (CARDIAC) 20 MG/ML IV SOLN
INTRAVENOUS | Status: AC
Start: 2014-11-15 — End: 2014-11-15
  Filled 2014-11-15: qty 5

## 2014-11-15 MED ORDER — ONDANSETRON HCL 4 MG/2ML IJ SOLN
INTRAMUSCULAR | Status: AC
  Filled 2014-11-15: qty 2

## 2014-11-15 MED ORDER — PROPOFOL INFUSION 10 MG/ML OPTIME
INTRAVENOUS | Status: DC | PRN
  Administered 2014-11-15: 120 mL via INTRAVENOUS
  Administered 2014-11-15 (×2): 10 mL via INTRAVENOUS

## 2014-11-15 MED ORDER — DOCUSATE SODIUM 100 MG PO CAPS: 100.0000 mg | ORAL_CAPSULE | Freq: Two times a day (BID) | ORAL | Status: DC

## 2014-11-15 MED ORDER — LACTATED RINGERS IV SOLN
INTRAVENOUS | Status: DC | PRN
  Administered 2014-11-15 (×3): via INTRAVENOUS

## 2014-11-15 MED ORDER — CEFAZOLIN SODIUM-DEXTROSE 2-3 GM-% IV SOLR
INTRAVENOUS | Status: AC
  Filled 2014-11-15: qty 50

## 2014-11-15 MED ORDER — PROPOFOL INFUSION 10 MG/ML OPTIME: INTRAVENOUS | Status: DC | PRN

## 2014-11-15 MED ORDER — METHOCARBAMOL 500 MG PO TABS
500.0000 mg | ORAL_TABLET | Freq: Four times a day (QID) | ORAL | Status: DC | PRN
Start: 2014-11-15 — End: 2014-11-15

## 2014-11-15 MED ORDER — ONDANSETRON HCL 4 MG/2ML IJ SOLN
INTRAMUSCULAR | Status: DC | PRN
  Administered 2014-11-15: 4 mg via INTRAVENOUS

## 2014-11-15 MED ORDER — HYDROMORPHONE HCL 1 MG/ML IJ SOLN
0.2500 mg | INTRAMUSCULAR | Status: DC | PRN
  Administered 2014-11-15 (×4): 0.5 mg via INTRAVENOUS

## 2014-11-15 MED ORDER — FERROUS SULFATE 325 (65 FE) MG PO TABS: 325.0000 mg | ORAL_TABLET | Freq: Three times a day (TID) | ORAL | Status: DC

## 2014-11-15 MED ORDER — PROMETHAZINE HCL 12.5 MG PO TABS
12.5000 mg | ORAL_TABLET | Freq: Four times a day (QID) | ORAL | Status: DC | PRN
Start: 2014-11-15 — End: 2015-10-17

## 2014-11-15 MED ORDER — MELOXICAM 15 MG PO TABS: 15.0000 mg | ORAL_TABLET | Freq: Every day | ORAL | Status: DC

## 2014-11-15 MED ORDER — HYDROMORPHONE HCL 1 MG/ML IJ SOLN
0.2500 mg | INTRAMUSCULAR | Status: DC | PRN
  Administered 2014-11-15 (×2): 0.5 mg via INTRAVENOUS

## 2014-11-15 MED ORDER — ASPIRIN EC 325 MG PO TBEC: 325.0000 mg | DELAYED_RELEASE_TABLET | Freq: Two times a day (BID) | ORAL | Status: AC

## 2014-11-15 MED ORDER — LIDOCAINE HCL (CARDIAC) 20 MG/ML IV SOLN
INTRAVENOUS | Status: DC | PRN
  Administered 2014-11-15: 50 mg via INTRAVENOUS

## 2014-11-15 MED ORDER — PROPOFOL 10 MG/ML IV BOLUS
INTRAVENOUS | Status: AC
  Filled 2014-11-15: qty 20

## 2014-11-15 MED ORDER — FENTANYL CITRATE 0.05 MG/ML IJ SOLN
INTRAMUSCULAR | Status: AC
  Filled 2014-11-15: qty 5

## 2014-11-15 MED ORDER — MIDAZOLAM HCL 2 MG/2ML IJ SOLN
INTRAMUSCULAR | Status: AC
  Filled 2014-11-15: qty 2

## 2014-11-15 MED ORDER — POLYETHYLENE GLYCOL 3350 17 G PO PACK: 17.0000 g | PACK | Freq: Two times a day (BID) | ORAL | Status: DC

## 2014-11-15 MED ORDER — PROPOFOL INFUSION 10 MG/ML OPTIME
INTRAVENOUS | Status: DC | PRN
  Administered 2014-11-15: 100 ug/kg/min via INTRAVENOUS

## 2014-11-15 MED ORDER — PROMETHAZINE HCL 25 MG/ML IJ SOLN
6.2500 mg | INTRAMUSCULAR | Status: DC | PRN
  Administered 2014-11-15: 6.25 mg via INTRAVENOUS
  Filled 2014-11-15: qty 1

## 2014-11-15 MED ORDER — METOCLOPRAMIDE HCL 5 MG/ML IJ SOLN
INTRAMUSCULAR | Status: AC
Start: 2014-11-15 — End: 2014-11-15
  Filled 2014-11-15: qty 2

## 2014-11-15 MED ORDER — DEXAMETHASONE SODIUM PHOSPHATE 10 MG/ML IJ SOLN
10.0000 mg | Freq: Once | INTRAMUSCULAR | Status: AC
  Administered 2014-11-15: 10 mg via INTRAVENOUS

## 2014-11-15 MED ORDER — DEXTROSE 5 % IV SOLN
500.0000 mg | Freq: Four times a day (QID) | INTRAVENOUS | Status: DC | PRN
  Administered 2014-11-15: 500 mg via INTRAVENOUS
  Filled 2014-11-15 (×2): qty 5

## 2014-11-15 MED ORDER — PHENYLEPHRINE 40 MCG/ML (10ML) SYRINGE FOR IV PUSH (FOR BLOOD PRESSURE SUPPORT)
PREFILLED_SYRINGE | INTRAVENOUS | Status: AC
Start: 2014-11-15 — End: 2014-11-15
  Filled 2014-11-15: qty 10

## 2014-11-15 MED ORDER — TRANEXAMIC ACID 100 MG/ML IV SOLN
1000.0000 mg | Freq: Once | INTRAVENOUS | Status: AC
  Administered 2014-11-15: 1000 mg via INTRAVENOUS
  Filled 2014-11-15: qty 10

## 2014-11-15 MED ORDER — METHOCARBAMOL 500 MG PO TABS: 500.0000 mg | ORAL_TABLET | Freq: Four times a day (QID) | ORAL | Status: DC

## 2014-11-15 MED ORDER — SODIUM CHLORIDE 0.9 % IR SOLN
Status: DC | PRN
  Administered 2014-11-15: 1000 mL

## 2014-11-15 MED ORDER — HYDROCODONE-ACETAMINOPHEN 7.5-325 MG PO TABS
1.0000 | ORAL_TABLET | ORAL | Status: DC | PRN
  Administered 2014-11-15: 1 via ORAL
  Filled 2014-11-15: qty 1

## 2014-11-15 MED ORDER — HYDROCODONE-ACETAMINOPHEN 7.5-325 MG PO TABS: 1.0000 | ORAL_TABLET | ORAL | Status: DC | PRN

## 2014-11-15 MED ORDER — PHENYLEPHRINE HCL 10 MG/ML IJ SOLN
INTRAMUSCULAR | Status: DC | PRN
  Administered 2014-11-15 (×2): 80 ug via INTRAVENOUS
  Administered 2014-11-15: 40 ug via INTRAVENOUS

## 2014-11-15 MED ORDER — BUPIVACAINE IN DEXTROSE 0.75-8.25 % IT SOLN
INTRATHECAL | Status: DC | PRN
  Administered 2014-11-15: 15 mg via INTRATHECAL

## 2014-11-15 MED ORDER — METOCLOPRAMIDE HCL 5 MG/ML IJ SOLN
INTRAMUSCULAR | Status: DC | PRN
  Administered 2014-11-15: 10 mg via INTRAVENOUS

## 2014-11-15 MED ORDER — CEPHALEXIN 500 MG PO CAPS: 500.0000 mg | ORAL_CAPSULE | Freq: Three times a day (TID) | ORAL | Status: DC

## 2014-11-15 MED ORDER — ROCURONIUM BROMIDE 100 MG/10ML IV SOLN
INTRAVENOUS | Status: AC
Start: 2014-11-15 — End: 2014-11-15
  Filled 2014-11-15: qty 1

## 2014-11-15 MED ORDER — CHLORHEXIDINE GLUCONATE 4 % EX LIQD: 60.0000 mL | Freq: Once | CUTANEOUS | Status: DC

## 2014-11-15 MED ORDER — MIDAZOLAM HCL 5 MG/5ML IJ SOLN
INTRAMUSCULAR | Status: DC | PRN
  Administered 2014-11-15: 2 mg via INTRAVENOUS

## 2014-11-15 SURGICAL SUPPLY — 39 items
ADH SKN CLS APL DERMABOND .7 (GAUZE/BANDAGES/DRESSINGS) ×1
BAG SPEC THK2 15X12 ZIP CLS (MISCELLANEOUS)
BAG ZIPLOCK 12X15 (MISCELLANEOUS) IMPLANT
CAPT HIP TOTAL 2 ×1 IMPLANT
COVER PERINEAL POST (MISCELLANEOUS) ×2 IMPLANT
DERMABOND ADVANCED (GAUZE/BANDAGES/DRESSINGS) ×1
DERMABOND ADVANCED .7 DNX12 (GAUZE/BANDAGES/DRESSINGS) IMPLANT
DRAPE C-ARM 42X120 X-RAY (DRAPES) ×2 IMPLANT
DRAPE STERI IOBAN 125X83 (DRAPES) ×2 IMPLANT
DRAPE U-SHAPE 47X51 STRL (DRAPES) ×6 IMPLANT
DRSG AQUACEL AG ADV 3.5X10 (GAUZE/BANDAGES/DRESSINGS) ×2 IMPLANT
DURAPREP 26ML APPLICATOR (WOUND CARE) ×2 IMPLANT
ELECT BLADE TIP CTD 4 INCH (ELECTRODE) ×2 IMPLANT
ELECT PENCIL ROCKER SW 15FT (MISCELLANEOUS) IMPLANT
ELECT REM PT RETURN 15FT ADLT (MISCELLANEOUS) IMPLANT
ELECT REM PT RETURN 9FT ADLT (ELECTROSURGICAL) ×2
ELECTRODE REM PT RTRN 9FT ADLT (ELECTROSURGICAL) ×1 IMPLANT
FACESHIELD WRAPAROUND (MASK) ×8 IMPLANT
GLOVE BIOGEL PI IND STRL 7.5 (GLOVE) ×1 IMPLANT
GLOVE BIOGEL PI IND STRL 8.5 (GLOVE) ×1 IMPLANT
GLOVE BIOGEL PI INDICATOR 7.5 (GLOVE) ×1
GLOVE BIOGEL PI INDICATOR 8.5 (GLOVE) ×1
GLOVE ECLIPSE 8.0 STRL XLNG CF (GLOVE) ×4 IMPLANT
GLOVE ORTHO TXT STRL SZ7.5 (GLOVE) ×2 IMPLANT
GOWN SPEC L3 XXLG W/TWL (GOWN DISPOSABLE) ×2 IMPLANT
GOWN STRL REUS W/TWL LRG LVL3 (GOWN DISPOSABLE) ×2 IMPLANT
HOLDER FOLEY CATH W/STRAP (MISCELLANEOUS) ×2 IMPLANT
KIT BASIN OR (CUSTOM PROCEDURE TRAY) ×2 IMPLANT
LIQUID BAND (GAUZE/BANDAGES/DRESSINGS) ×2 IMPLANT
PACK TOTAL JOINT (CUSTOM PROCEDURE TRAY) ×2 IMPLANT
SAW OSC TIP CART 19.5X105X1.3 (SAW) ×2 IMPLANT
SUT MNCRL AB 4-0 PS2 18 (SUTURE) ×2 IMPLANT
SUT VIC AB 1 CT1 36 (SUTURE) ×6 IMPLANT
SUT VIC AB 2-0 CT1 27 (SUTURE) ×4
SUT VIC AB 2-0 CT1 TAPERPNT 27 (SUTURE) ×2 IMPLANT
SUT VLOC 180 0 24IN GS25 (SUTURE) ×2 IMPLANT
TOWEL OR 17X26 10 PK STRL BLUE (TOWEL DISPOSABLE) ×2 IMPLANT
TOWEL OR NON WOVEN STRL DISP B (DISPOSABLE) IMPLANT
TRAY FOLEY CATH 14FRSI W/METER (CATHETERS) ×1 IMPLANT

## 2014-11-15 NOTE — Progress Notes (Signed)
Pt c/o pain level 8 with pain worsening; Dr. Council [place], anesthes, notified, orders rec'd and meds given

## 2014-11-15 NOTE — Progress Notes (Signed)
Pt more comfortable; dozing at long intervals; resps shallow but arouses easily; SAO2 100; nasal cannula at 2L/min

## 2014-11-15 NOTE — Transfer of Care (Signed)
Immediate Anesthesia Transfer of Care Note  Patient: Kelly Sanders  Procedure(s) Performed: Procedure(s): LEFT TOTAL HIP ARTHROPLASTY ANTERIOR APPROACH (Left)  Patient Location: PACU  Anesthesia Type:General and Spinal  Level of Consciousness: sedated  Airway & Oxygen Therapy: Patient Spontanous Breathing and Patient connected to face mask oxygen  Post-op Assessment: Report given to PACU RN and Post -op Vital signs reviewed and stable  Post vital signs: Reviewed and stable  Complications: No apparent anesthesia complications

## 2014-11-15 NOTE — Progress Notes (Signed)
Pt transferred to short stay unit to prepare for discharge later today to home; pt states pain much less, still drowsy; P Mentley, RN to monitor pt closely in Stage 2 short stay

## 2014-11-15 NOTE — Care Management Note (Signed)
    Page 1 of 2   11/15/2014     12:43:51 PM CARE MANAGEMENT NOTE 11/15/2014  Patient:  ROBT, OKUDA   Account Number:  [id number]  Date Initiated:  11/15/2014  Documentation initiated by:  [facility]  Subjective/Objective Assessment:   short stay: LEFT TOTAL HIP ARTHROPLASTY ANTERIOR APPROACH (Left)     Action/Plan:   discharge planning   Anticipated DC Date:  11/15/2014   Anticipated DC Plan:  [place]  CM consult      [facility] Choice  HOME HEALTH   Choice offered to / List presented to:  C-1 Patient   DME arranged  3-N-1      DME agency  [place] arranged  [place]   Status of service:  Completed, signed off Medicare Important Message given?   (If response is "NO", the following Medicare IM given date fields will be blank) Date Medicare IM given:   Medicare IM given by:   Date Additional Medicare IM given:   Additional Medicare IM given by:    Discharge Disposition:  [place]  Per UR Regulation:    If discussed at Long Length of Stay Meetings, dates discussed:    Comments:  11/15/14 12:00 CM met with pt and sig. oter, Foye Spurling in room to offer choice of home health agency.  Pt chooses [facility] to render HHPT.  Pt has a rolling walker but needs a 3n1.  CM called [facility] DME rep, Lecretia to please deliver 3n1 to rooom 30 prior to discharge.  CM gave referral to Shaune Leeks with contact information.  No other CM needs were communicated.

## 2014-11-15 NOTE — Evaluation (Signed)
Physical Therapy Evaluation-1x eval Patient Details Name: Kelly Sanders MRN: [id number] DOB: Nov 07, 1960 Today's Date: 11/15/2014   History of Present Illness  54 yo male s/p L THA-DA 11/15/13  Clinical Impression  On eval, pt was supervision level assist for mobility-able to ambulate ~150 feet with rolling walker. Pt tolerated session well. No lightheadedness/dizziness with mobility. Reviewed exercises, step negotiation, and car transfer. Recommended to pt/significant other that he stay on 1st level until HHPT comes out to practice steps up to 2nd level.     Follow Up Recommendations Home health PT;Supervision - Intermittent    Equipment Recommendations  None recommended by PT    Recommendations for Other Services       Precautions / Restrictions Precautions Precautions: None Restrictions Weight Bearing Restrictions: No LLE Weight Bearing: Weight bearing as tolerated      Mobility  Bed Mobility Overal bed mobility: Modified Independent             General bed mobility comments: pt able to get L LE on/off bed without assistance  Transfers Overall transfer level: Needs assistance   Transfers: Sit to/from Stand Sit to Stand: Supervision         General transfer comment: VCs safety, hand placement  Ambulation/Gait Ambulation/Gait assistance: Supervision Ambulation Distance (Feet): 150 Feet Assistive device: Rolling walker (2 wheeled) Gait Pattern/deviations: Step-through pattern;Decreased stride length;Decreased step length - left     General Gait Details: VCs safety, sequence.   Stairs            Wheelchair Mobility    Modified Rankin (Stroke Patients Only)       Balance                                             Pertinent Vitals/Pain Pain Assessment: 0-10 Pain Score: 4  Pain Location: L hip with activity Pain Intervention(s): Monitored during session    Home Living Family/patient expects to be discharged to::  Private residence     Type of Home: House Home Access: Stairs to enter   [employer] of Steps: 1 small step Home Layout: Two level;Able to live on main level with bedroom/bathroom Home Equipment: Walker - 2 wheels      Prior Function Level of Independence: Independent               Hand Dominance        Extremity/Trunk Assessment   Upper Extremity Assessment: Overall WFL for tasks assessed           Lower Extremity Assessment: LLE deficits/detail   LLE Deficits / Details: strength at least 3/5  Cervical / Trunk Assessment: Normal  Communication   Communication: No difficulties  Cognition Arousal/Alertness: Awake/alert Behavior During Therapy: WFL for tasks assessed/performed Overall Cognitive Status: Within Functional Limits for tasks assessed                      General Comments      Exercises Total Joint Exercises Ankle Circles/Pumps: AROM;Both;10 reps;Supine Quad Sets: AROM;Both;10 reps;Supine Heel Slides: AAROM;Left;10 reps;Supine Hip ABduction/ADduction: AAROM;Left;10 reps;Supine      Assessment/Plan    PT Assessment All further PT needs can be met in the next venue of care  PT Diagnosis Acute pain;Difficulty walking   PT Problem List Decreased strength;Decreased range of motion;Decreased balance;Decreased mobility;Pain;Decreased knowledge of use of DME  PT Treatment Interventions  PT Goals (Current goals can be found in the Care Plan section) Acute Rehab PT Goals Patient Stated Goal: home today PT Goal Formulation: All assessment and education complete, DC therapy    Frequency     Barriers to discharge        Co-evaluation               End of Session Equipment Utilized During Treatment: Gait belt Activity Tolerance: Patient tolerated treatment well Patient left: in bed;with family/visitor present           Time: 1450-1511 PT Time Calculation (min) (ACUTE ONLY): 21 min   Charges:   PT  Evaluation $Initial PT Evaluation Tier I: 1 Procedure PT Treatments $Gait Training: 8-22 mins   PT G Codes:        Weston Anna, MPT Pager: [phone]

## 2014-11-15 NOTE — Anesthesia Postprocedure Evaluation (Signed)
  Anesthesia Post-op Note  Patient: Kelly Sanders  Procedure(s) Performed: Procedure(s) (LRB): LEFT TOTAL HIP ARTHROPLASTY ANTERIOR APPROACH (Left)  Patient Location: PACU  Anesthesia Type: failed spinal requiring general.  Level of Consciousness: awake and alert   Airway and Oxygen Therapy: Patient Spontanous Breathing  Post-op Pain: mild  Post-op Assessment: Post-op Vital signs reviewed, Patient's Cardiovascular Status Stable, Respiratory Function Stable, Patent Airway and No signs of Nausea or vomiting  Last Vitals:  Filed Vitals:   11/15/14 1116  BP: 144/67  Pulse: 70  Temp: 36.3 C  Resp: 16    Post-op Vital Signs: stable   Complications: No apparent anesthesia complications

## 2014-11-15 NOTE — Op Note (Addendum)
NAME:  Kelly Sanders                ACCOUNT NO.: [id number]      MEDICAL RECORD NO.: [id number]      FACILITY:  [facility]      PHYSICIAN:  Durene Romans D  DATE OF BIRTH:  Feb 24, 1961     DATE OF PROCEDURE:  11/15/2014                                 OPERATIVE REPORT         PREOPERATIVE DIAGNOSIS: Left  hip osteoarthritis.      POSTOPERATIVE DIAGNOSIS:  Left hip osteoarthritis.      PROCEDURE:  Left total hip replacement through an anterior approach   utilizing DePuy THR system, component size 54mm pinnacle cup, a size 36+4 neutral   Altrex liner, a size 6 Hi Tri Lock stem with a 36+1.5 delta ceramic   ball.      SURGEON:  Madlyn Frankel. Charlann Boxer, M.D.      ASSISTANT:  Lanney Gins, PA-C     ANESTHESIA:  General and Spinal.      SPECIMENS:  None.      COMPLICATIONS:  None.      BLOOD LOSS:  400 cc     DRAINS:  None.      INDICATION OF THE PROCEDURE:  Kelly Sanders is a 54 y.o. male who had   presented to office for evaluation of left hip pain.  Radiographs revealed   progressive degenerative changes with bone-on-bone   articulation to the  hip joint.  The patient had painful limited range of   motion significantly affecting their overall quality of life.  The patient was failing to    respond to conservative measures, and at this point was ready   to proceed with more definitive measures.  The patient has noted progressive   degenerative changes in his hip, progressive problems and dysfunction   with regarding the hip prior to surgery.  Consent was obtained for   benefit of pain relief.  Specific risk of infection, DVT, component   failure, dislocation, need for revision surgery, as well discussion of   the anterior versus posterior approach were reviewed.  Consent was   obtained for benefit of anterior pain relief through an anterior   approach.      PROCEDURE IN DETAIL:  The patient was brought to operative theater.   Once adequate  anesthesia, preoperative antibiotics, 2gm of Ancef, 1 gm of Tranexamic acid, and  of Decadron administered.   The patient was positioned supine on the OSI Hanna table.  Once adequate   padding of boney process was carried out, we had predraped out the hip, and  used fluoroscopy to confirm orientation of the pelvis and position.      The left hip was then prepped and draped from proximal iliac crest to   mid thigh with shower curtain technique.      Time-out was performed identifying the patient, planned procedure, and   extremity.     An incision was then made 2 cm distal and lateral to the   anterior superior iliac spine extending over the orientation of the   tensor fascia lata muscle and sharp dissection was carried down to the   fascia of the muscle and protractor placed in the soft tissues.      The fascia  was then incised.  The muscle belly was identified and swept   laterally and retractor placed along the superior neck.  Following   cauterization of the circumflex vessels and removing some pericapsular   fat, a second cobra retractor was placed on the inferior neck.  A third   retractor was placed on the anterior acetabulum after elevating the   anterior rectus.  A L-capsulotomy was along the line of the   superior neck to the trochanteric fossa, then extended proximally and   distally.  Tag sutures were placed and the retractors were then placed   intracapsular.  We then identified the trochanteric fossa and   orientation of my neck cut, confirmed this radiographically   and then made a neck osteotomy with the femur on traction.  The femoral   head was removed without difficulty or complication.  Traction was let   off and retractors were placed posterior and anterior around the   acetabulum.      The labrum and foveal tissue were debrided.  I began reaming with a 49mm   reamer and reamed up to 53mm reamer with good bony bed preparation and a 54mm   cup was chosen.  The  final 54mm Pinnacle cup was then impacted under fluoroscopy  to confirm the depth of penetration and orientation with respect to   abduction.  A screw was placed followed by the hole eliminator.  The final   36+4 neutral Altrex liner was impacted with good visualized rim fit.  The cup was positioned anatomically within the acetabular portion of the pelvis.      At this point, the femur was rolled at 80 degrees.  Further capsule was   released off the inferior aspect of the femoral neck.  I then   released the superior capsule proximally.  The hook was placed laterally   along the femur and elevated manually and held in position with the bed   hook.  The leg was then extended and adducted with the leg rolled to 100   degrees of external rotation.  Once the proximal femur was fully   exposed, I used a box osteotome to set orientation.  I then began   broaching with the starting chili pepper broach and passed this by hand and then broached up to 6.  With the 6 broach in place I chose a high offset neck and did a trial reduction.  The offset was appropriate, leg lengths   appeared to be equal, confirmed radiographically.   Given these findings, I went ahead and dislocated the hip, repositioned all   retractors and positioned the right hip in the extended and abducted position.  The final 6 Hi Tri Lock stem was   chosen and it was impacted down to the level of neck cut.  Based on this   and the trial reduction, a 36+1.5 delta ceramic ball was chosen and   impacted onto a clean and dry trunnion, and the hip was reduced.  The   hip had been irrigated throughout the case again at this point.  I did   reapproximate the superior capsular leaflet to the anterior leaflet   using #1 Vicryl.  The fascia of the   tensor fascia lata muscle was then reapproximated using #1 Vicryl and #0 V-lock sutures.  The   remaining wound was closed with 2-0 Vicryl and running 4-0 Monocryl.   The hip was cleaned, dried,  and dressed sterilely using Dermabond and  Aquacel dressing.  He was then brought   to recovery room in stable condition tolerating the procedure well.    Lanney GinsMatthew Babish, PA-C was present for the entirety of the case involved from   preoperative positioning, perioperative retractor management, general   facilitation of the case, as well as primary wound closure as assistant.            Madlyn FrankelMatthew D. Charlann Boxerlin, M.D.        11/15/2014 8:39 AM

## 2014-11-15 NOTE — Progress Notes (Addendum)
Patient is waiting on PT to get him up for the first time after left hip replacement surgery. RN assists him to sit on edge of bed with side table in front of him. He tolerates this well.  1415  Patient lies back down. He states he got slightly dizzy after sitting up for 30 minutes. PAS reapplied.

## 2014-11-15 NOTE — Anesthesia Procedure Notes (Signed)
Spinal Patient location during procedure: OR Start time: 11/15/2014 7:20 AM End time: 11/15/2014 7:26 AM Preanesthetic Checklist Completed: patient identified, site marked, surgical consent, pre-op evaluation, timeout performed, IV checked, risks and benefits discussed and monitors and equipment checked Spinal Block Patient position: sitting Prep: Betadine Patient monitoring: heart rate, cardiac monitor, continuous pulse ox and blood pressure Approach: midline Location: L3-4 Injection technique: single-shot Needle Needle type: Sprotte  Needle gauge: 24 G Needle length: 5 cm Needle insertion depth: 3 cm Assessment Sensory level: T10

## 2014-11-15 NOTE — Interval H&P Note (Signed)
History and Physical Interval Note:  11/15/2014 6:51 AM  Kelly Sanders  has presented today for surgery, with the diagnosis of left hip osteoarthritis  The various methods of treatment have been discussed with the patient and family. After consideration of risks, benefits and other options for treatment, the patient has consented to  Procedure(s): LEFT TOTAL HIP ARTHROPLASTY ANTERIOR APPROACH (Left) as a surgical intervention .  The patient's history has been reviewed, patient examined, no change in status, stable for surgery.  I have reviewed the patient's chart and labs.  Questions were answered to the patient's satisfaction.     Shelda PalLIN,Ibtisam Benge D

## 2014-11-15 NOTE — Progress Notes (Signed)
   11/15/14 1521  PT Time Calculation  PT Start Time (ACUTE ONLY) 1450  PT Stop Time (ACUTE ONLY) 1511  PT Time Calculation (min) (ACUTE ONLY) 21 min  PT G-Codes **NOT FOR INPATIENT CLASS**  Functional Assessment Tool Used (clinical judgement)  Functional Limitation Mobility: Walking and moving around  Mobility: Walking and Moving Around Current Status (Z6109(G8978) CI  Mobility: Walking and Moving Around Goal Status (U0454(G8979) CI  Mobility: Walking and Moving Around Discharge Status (U9811(G8980) CI  PT General Charges  $$ ACUTE PT VISIT 1 Procedure  PT Evaluation  $Initial PT Evaluation Tier I 1 Procedure  PT Treatments  $Gait Training 8-22 mins   Rebeca AlertJannie Emmersyn Kratzke, MPT [phone]

## 2014-11-16 ENCOUNTER — Encounter (HOSPITAL_COMMUNITY): Payer: Self-pay | Admitting: Orthopedic Surgery

## 2015-09-01 ENCOUNTER — Telehealth: Payer: Self-pay

## 2015-09-01 DIAGNOSIS — F411 Generalized anxiety disorder: Secondary | ICD-10-CM

## 2015-09-01 NOTE — Telephone Encounter (Signed)
REFILL REQUEST  clonazePAM (KLONOPIN) 0.5 MG tablet   Has appt for Dec 19 with Kelly Sanders   [name]   [phone] (H)

## 2015-09-02 MED ORDER — CLONAZEPAM 0.5 MG PO TABS: 0.2500 mg | ORAL_TABLET | Freq: Two times a day (BID) | ORAL | Status: DC | PRN

## 2015-09-04 NOTE — Telephone Encounter (Signed)
Faxed and pt notified.

## 2015-10-05 HISTORY — PX: ELBOW SURGERY: SHX618

## 2015-10-18 ENCOUNTER — Encounter (HOSPITAL_COMMUNITY): Payer: Self-pay | Admitting: *Deleted

## 2015-10-18 MED ORDER — CLINDAMYCIN PHOSPHATE 900 MG/50ML IV SOLN
900.0000 mg | INTRAVENOUS | Status: AC
  Administered 2015-10-19: 900 mg via INTRAVENOUS

## 2015-10-18 NOTE — Progress Notes (Signed)
Pt denies SOB, chest pain, and being under the care of a cardiologist. Pt denies having a stress test, echo and cardiac cath. Pt made aware to stop taking  Aspirin, otc vitamins, fish oil, and herbal medications. Do not take any NSAIDs ie: Ibuprofen, Advil, Naproxen or any medication containing Aspirin. Pt verbalized understanding of all pre-op instructions. 

## 2015-10-19 ENCOUNTER — Encounter (HOSPITAL_COMMUNITY): Payer: Self-pay | Admitting: *Deleted

## 2015-10-19 ENCOUNTER — Encounter (HOSPITAL_COMMUNITY)
Admission: RE | Disposition: A | Discharge status: Home / Self Care | Payer: Self-pay | Source: Ambulatory Visit | Attending: Orthopedic Surgery

## 2015-10-19 ENCOUNTER — Ambulatory Visit (HOSPITAL_COMMUNITY): Payer: 59 | Admitting: Anesthesiology

## 2015-10-19 ENCOUNTER — Ambulatory Visit (HOSPITAL_COMMUNITY)
Admission: RE | Admit: 2015-10-19 | Discharge: 2015-10-19 | Disposition: A | Discharge status: Home / Self Care | Payer: 59 | Source: Ambulatory Visit | Attending: Orthopedic Surgery | Admitting: Orthopedic Surgery

## 2015-10-19 DIAGNOSIS — Z96642 Presence of left artificial hip joint: Secondary | ICD-10-CM | POA: Insufficient documentation

## 2015-10-19 DIAGNOSIS — M25721 Osteophyte, right elbow: Secondary | ICD-10-CM | POA: Insufficient documentation

## 2015-10-19 DIAGNOSIS — M25521 Pain in right elbow: Secondary | ICD-10-CM | POA: Diagnosis present

## 2015-10-19 DIAGNOSIS — M7021 Olecranon bursitis, right elbow: Secondary | ICD-10-CM | POA: Diagnosis not present

## 2015-10-19 DIAGNOSIS — M1990 Unspecified osteoarthritis, unspecified site: Secondary | ICD-10-CM | POA: Insufficient documentation

## 2015-10-19 DIAGNOSIS — E785 Hyperlipidemia, unspecified: Secondary | ICD-10-CM | POA: Diagnosis not present

## 2015-10-19 DIAGNOSIS — I10 Essential (primary) hypertension: Secondary | ICD-10-CM | POA: Diagnosis not present

## 2015-10-19 HISTORY — PX: OLECRANON BURSECTOMY: SHX2097

## 2015-10-19 LAB — BASIC METABOLIC PANEL
Anion gap: 9 (ref 5–15)
BUN: 20 mg/dL (ref 6–20)
CALCIUM: 9.3 mg/dL (ref 8.9–10.3)
CO2: 26 mmol/L (ref 22–32)
CREATININE: 1.12 mg/dL (ref 0.61–1.24)
Chloride: 107 mmol/L (ref 101–111)
Glucose, Bld: 93 mg/dL (ref 65–99)
Potassium: 4.1 mmol/L (ref 3.5–5.1)
SODIUM: 142 mmol/L (ref 135–145)

## 2015-10-19 LAB — CBC
HCT: 44.5 % (ref 39.0–52.0)
Hemoglobin: 15.4 g/dL (ref 13.0–17.0)
MCH: 32.2 pg (ref 26.0–34.0)
MCHC: 34.6 g/dL (ref 30.0–36.0)
MCV: 92.9 fL (ref 78.0–100.0)
PLATELETS: 246 10*3/uL (ref 150–400)
RBC: 4.79 MIL/uL (ref 4.22–5.81)
RDW: 11.8 % (ref 11.5–15.5)
WBC: 5.8 10*3/uL (ref 4.0–10.5)

## 2015-10-19 SURGERY — BURSECTOMY, ELBOW
Anesthesia: General | Laterality: Right

## 2015-10-19 MED ORDER — FENTANYL CITRATE (PF) 250 MCG/5ML IJ SOLN
INTRAMUSCULAR | Status: AC
  Filled 2015-10-19: qty 5

## 2015-10-19 MED ORDER — ONDANSETRON HCL 4 MG/2ML IJ SOLN
INTRAMUSCULAR | Status: AC
  Filled 2015-10-19: qty 2

## 2015-10-19 MED ORDER — LACTATED RINGERS IV SOLN
INTRAVENOUS | Status: DC | PRN
  Administered 2015-10-19: 17:00:00 via INTRAVENOUS

## 2015-10-19 MED ORDER — BUPIVACAINE-EPINEPHRINE 0.25% -1:200000 IJ SOLN
INTRAMUSCULAR | Status: AC
  Filled 2015-10-19: qty 1

## 2015-10-19 MED ORDER — FENTANYL CITRATE (PF) 100 MCG/2ML IJ SOLN
INTRAMUSCULAR | Status: DC
Start: 2015-10-19 — End: 2015-10-19
  Filled 2015-10-19: qty 2

## 2015-10-19 MED ORDER — 0.9 % SODIUM CHLORIDE (POUR BTL) OPTIME
TOPICAL | Status: DC | PRN
  Administered 2015-10-19: 1000 mL

## 2015-10-19 MED ORDER — MIDAZOLAM HCL 2 MG/2ML IJ SOLN
INTRAMUSCULAR | Status: AC
  Filled 2015-10-19: qty 2

## 2015-10-19 MED ORDER — LIDOCAINE HCL (CARDIAC) 20 MG/ML IV SOLN
INTRAVENOUS | Status: AC
  Filled 2015-10-19: qty 5

## 2015-10-19 MED ORDER — CLINDAMYCIN PHOSPHATE 900 MG/50ML IV SOLN
INTRAVENOUS | Status: AC
  Filled 2015-10-19: qty 50

## 2015-10-19 MED ORDER — MIDAZOLAM HCL 5 MG/5ML IJ SOLN
INTRAMUSCULAR | Status: DC | PRN
  Administered 2015-10-19: 2 mg via INTRAVENOUS

## 2015-10-19 MED ORDER — DOCUSATE SODIUM 100 MG PO CAPS: 100.0000 mg | ORAL_CAPSULE | Freq: Two times a day (BID) | ORAL | Status: DC

## 2015-10-19 MED ORDER — FENTANYL CITRATE (PF) 100 MCG/2ML IJ SOLN
INTRAMUSCULAR | Status: DC | PRN
  Administered 2015-10-19: 25 ug via INTRAVENOUS
  Administered 2015-10-19: 100 ug via INTRAVENOUS
  Administered 2015-10-19: 25 ug via INTRAVENOUS
  Administered 2015-10-19 (×2): 50 ug via INTRAVENOUS

## 2015-10-19 MED ORDER — FENTANYL CITRATE (PF) 100 MCG/2ML IJ SOLN
25.0000 ug | INTRAMUSCULAR | Status: DC | PRN
  Administered 2015-10-19 (×2): 50 ug via INTRAVENOUS

## 2015-10-19 MED ORDER — BUPIVACAINE-EPINEPHRINE 0.25% -1:200000 IJ SOLN
INTRAMUSCULAR | Status: DC | PRN
  Administered 2015-10-19: 50 mL

## 2015-10-19 MED ORDER — EPHEDRINE SULFATE 50 MG/ML IJ SOLN
INTRAMUSCULAR | Status: DC | PRN
  Administered 2015-10-19 (×3): 10 mg via INTRAVENOUS

## 2015-10-19 MED ORDER — ONDANSETRON HCL 4 MG/2ML IJ SOLN
INTRAMUSCULAR | Status: DC | PRN
  Administered 2015-10-19: 4 mg via INTRAVENOUS

## 2015-10-19 MED ORDER — LIDOCAINE HCL (CARDIAC) 20 MG/ML IV SOLN
INTRAVENOUS | Status: DC | PRN
  Administered 2015-10-19: 100 mg via INTRAVENOUS

## 2015-10-19 MED ORDER — CHLORHEXIDINE GLUCONATE 4 % EX LIQD
60.0000 mL | Freq: Once | CUTANEOUS | Status: AC
  Administered 2015-10-19: 4 via TOPICAL

## 2015-10-19 MED ORDER — LACTATED RINGERS IV SOLN: INTRAVENOUS | Status: DC

## 2015-10-19 MED ORDER — PROPOFOL 10 MG/ML IV BOLUS
INTRAVENOUS | Status: DC | PRN
  Administered 2015-10-19: 200 mg via INTRAVENOUS

## 2015-10-19 MED ORDER — DEXAMETHASONE SODIUM PHOSPHATE 10 MG/ML IJ SOLN
INTRAMUSCULAR | Status: DC | PRN
  Administered 2015-10-19: 10 mg via INTRAVENOUS

## 2015-10-19 MED ORDER — PROPOFOL 10 MG/ML IV BOLUS
INTRAVENOUS | Status: AC
  Filled 2015-10-19: qty 20

## 2015-10-19 MED ORDER — OXYCODONE-ACETAMINOPHEN 5-325 MG PO TABS: 1.0000 | ORAL_TABLET | ORAL | Status: DC | PRN

## 2015-10-19 MED ORDER — DEXAMETHASONE SODIUM PHOSPHATE 10 MG/ML IJ SOLN
INTRAMUSCULAR | Status: AC
  Filled 2015-10-19: qty 1

## 2015-10-19 MED ORDER — OXYCODONE-ACETAMINOPHEN 5-325 MG PO TABS
ORAL_TABLET | ORAL | Status: AC
  Filled 2015-10-19: qty 1

## 2015-10-19 SURGICAL SUPPLY — 46 items
BAG SPEC THK2 15X12 ZIP CLS (MISCELLANEOUS)
BAG ZIPLOCK 12X15 (MISCELLANEOUS) IMPLANT
BANDAGE ACE 4X5 VEL STRL LF (GAUZE/BANDAGES/DRESSINGS) ×4 IMPLANT
BANDAGE ELASTIC 3 VELCRO ST LF (GAUZE/BANDAGES/DRESSINGS) IMPLANT
BLADE OSCILLATING/SAGITTAL (BLADE)
BLADE SW THK.38XMED LNG THN (BLADE) IMPLANT
BNDG CMPR 9X4 STRL LF SNTH (GAUZE/BANDAGES/DRESSINGS) ×1
BNDG ESMARK 4X9 LF (GAUZE/BANDAGES/DRESSINGS) ×2 IMPLANT
BNDG GAUZE ELAST 4 BULKY (GAUZE/BANDAGES/DRESSINGS) ×2 IMPLANT
CORDS BIPOLAR (ELECTRODE) ×1 IMPLANT
CUFF TOURN SGL QUICK 18 (TOURNIQUET CUFF) ×2 IMPLANT
DRAIN PENROSE 18X1/4 LTX STRL (WOUND CARE) IMPLANT
DRAPE OEC MINIVIEW 54X84 (DRAPES) ×1 IMPLANT
DRAPE SURG 17X11 SM STRL (DRAPES) ×1 IMPLANT
DRSG EMULSION OIL 3X3 NADH (GAUZE/BANDAGES/DRESSINGS) ×2 IMPLANT
ELECT REM PT RETURN 9FT ADLT (ELECTROSURGICAL) ×2
ELECTRODE REM PT RTRN 9FT ADLT (ELECTROSURGICAL) ×1 IMPLANT
GAUZE SPONGE 4X4 12PLY STRL (GAUZE/BANDAGES/DRESSINGS) ×2 IMPLANT
GLOVE SURG ORTHO 8.0 STRL STRW (GLOVE) ×2 IMPLANT
GOWN STRL REUS W/TWL XL LVL3 (GOWN DISPOSABLE) ×2 IMPLANT
KIT BASIN OR (CUSTOM PROCEDURE TRAY) ×2 IMPLANT
MANIFOLD NEPTUNE II (INSTRUMENTS) ×2 IMPLANT
NS IRRIG 1000ML POUR BTL (IV SOLUTION) ×2 IMPLANT
PACK SHOULDER (CUSTOM PROCEDURE TRAY) ×2 IMPLANT
PAD CAST 3X4 CTTN HI CHSV (CAST SUPPLIES) IMPLANT
PAD CAST 4YDX4 CTTN HI CHSV (CAST SUPPLIES) IMPLANT
PADDING CAST ABS 4INX4YD NS (CAST SUPPLIES) ×1
PADDING CAST ABS COTTON 4X4 ST (CAST SUPPLIES) ×1 IMPLANT
PADDING CAST COTTON 3X4 STRL (CAST SUPPLIES)
PADDING CAST COTTON 4X4 STRL (CAST SUPPLIES)
POSITIONER SURGICAL ARM (MISCELLANEOUS) ×2 IMPLANT
SOL PREP POV-IOD 4OZ 10% (MISCELLANEOUS) ×2 IMPLANT
SOL PREP PROV IODINE SCRUB 4OZ (MISCELLANEOUS) ×1 IMPLANT
SPLINT FIBERGLASS 4X30 (CAST SUPPLIES) ×2 IMPLANT
SPONGE SURGIFOAM ABS GEL 100 (HEMOSTASIS) ×1 IMPLANT
SUT MNCRL AB 3-0 PS2 18 (SUTURE) ×1 IMPLANT
SUT MNCRL AB 4-0 PS2 18 (SUTURE) ×1 IMPLANT
SUT NYLON 3 0 (SUTURE) ×1 IMPLANT
SUT PROLENE 3 0 FS 2 (SUTURE) ×2 IMPLANT
SUT PROLENE 3 0 PS 2 (SUTURE) ×2 IMPLANT
SUT VIC AB 0 CT1 36 (SUTURE) IMPLANT
SUT VIC AB 1 CT1 36 (SUTURE) ×1 IMPLANT
SUT VIC AB 2-0 CT1 27 (SUTURE)
SUT VIC AB 2-0 CT1 TAPERPNT 27 (SUTURE) IMPLANT
TOWEL OR 17X26 10 PK STRL BLUE (TOWEL DISPOSABLE) ×2 IMPLANT
WATER STERILE IRR 1500ML POUR (IV SOLUTION) ×1 IMPLANT

## 2015-10-19 NOTE — Transfer of Care (Signed)
Immediate Anesthesia Transfer of Care Note  Patient: Kelly Sanders  Procedure(s) Performed: Procedure(s): RIGHT ELBOW OLEBRANON BURSITECTOMY AND OSTEOPHYTE EXCISION (Right)  Patient Location: PACU  Anesthesia Type:General  Level of Consciousness:  sedated, patient cooperative and responds to stimulation  Airway & Oxygen Therapy:Patient Spontanous Breathing and Patient connected to face mask oxgen  Post-op Assessment:  Report given to PACU RN and Post -op Vital signs reviewed and stable  Post vital signs:  Reviewed and stable  Last Vitals:  Filed Vitals:   10/19/15 1352  BP: 149/85  Pulse: 74  Temp: 36.6 C  Resp: 16    Complications: No apparent anesthesia complications

## 2015-10-19 NOTE — Brief Op Note (Signed)
10/19/2015  12:54 PM  PATIENT:  Rodman KeyWilliam C Avena  54 y.o. male  PRE-OPERATIVE DIAGNOSIS:  RIGHT ELBOW OLECRANON BURSITIS  POST-OPERATIVE DIAGNOSIS:  * No post-op diagnosis entered *  PROCEDURE:  Procedure(s): RIGHT ELBOW OLEBRANON BURSITECTOMY AND OSTEOPHYTE EXCISION (Right)  SURGEON:  Surgeon(s) and Role:    * Bradly BienenstockFred Inri Sobieski, MD - Primary  PHYSICIAN ASSISTANT:   ASSISTANTS: none   ANESTHESIA:   general  EBL:     BLOOD ADMINISTERED:none  DRAINS: none   LOCAL MEDICATIONS USED:  MARCAINE     SPECIMEN:  No Specimen  DISPOSITION OF SPECIMEN:  N/A  COUNTS:  YES  TOURNIQUET:  * No tourniquets in log *  DICTATION: .Other Dictation: Dictation Number [id number]  PLAN OF CARE: Discharge to home after PACU  PATIENT DISPOSITION:  PACU - hemodynamically stable.   Delay start of Pharmacological VTE agent (>24hrs) due to surgical blood loss or risk of bleeding: not applicable

## 2015-10-19 NOTE — Anesthesia Procedure Notes (Signed)
Procedure Name: LMA Insertion Date/Time: 10/19/2015 5:08 PM Performed by: Orest DikesPETERS, Latavia Goga J Pre-anesthesia Checklist: Patient identified, Emergency Drugs available, Suction available and Patient being monitored Patient Re-evaluated:Patient Re-evaluated prior to inductionOxygen Delivery Method: Circle system utilized Preoxygenation: Pre-oxygenation with 100% oxygen Intubation Type: IV induction LMA: LMA inserted LMA Size: 5.0 Number of attempts: 1 Placement Confirmation: positive ETCO2 and breath sounds checked- equal and bilateral Tube secured with: Tape Dental Injury: Teeth and Oropharynx as per pre-operative assessment

## 2015-10-19 NOTE — Discharge Instructions (Signed)
KEEP BANDAGE CLEAN AND DRY CALL OFFICE FOR F/U APPT [phone] in 8 days KEEP HAND ELEVATED ABOVE HEART OK TO APPLY ICE TO OPERATIVE AREA CONTACT OFFICE IF ANY WORSENING PAIN OR CONCERNS. Post Anesthesia Home Care Instructions  Activity: Get plenty of rest for the remainder of the day. A responsible adult should stay with you for 24 hours following the procedure.  For the next 24 hours, DO NOT: -Drive a car -Advertising copywriterperate machinery -Drink alcoholic beverages -Take any medication unless instructed by your physician -Make any legal decisions or sign important papers.  Meals: Start with liquid foods such as gelatin or soup. Progress to regular foods as tolerated. Avoid greasy, spicy, heavy foods. If nausea and/or vomiting occur, drink only clear liquids until the nausea and/or vomiting subsides. Call your physician if vomiting continues.  Special Instructions/Symptoms: Your throat may feel dry or sore from the anesthesia or the breathing tube placed in your throat during surgery. If this causes discomfort, gargle with warm salt water. The discomfort should disappear within 24 hours.  If you had a scopolamine patch placed behind your ear for the management of post- operative nausea and/or vomiting:  1. The medication in the patch is effective for 72 hours, after which it should be removed.  Wrap patch in a tissue and discard in the trash. Wash hands thoroughly with soap and water. 2. You may remove the patch earlier than 72 hours if you experience unpleasant side effects which may include dry mouth, dizziness or visual disturbances. 3. Avoid touching the patch. Wash your hands with soap and water after contact with the patch.

## 2015-10-19 NOTE — Anesthesia Postprocedure Evaluation (Signed)
Anesthesia Post Note  Patient: Kelly Sanders  Procedure(s) Performed: Procedure(s) (LRB): RIGHT ELBOW OLEBRANON BURSITECTOMY AND OSTEOPHYTE EXCISION (Right)  Patient location during evaluation: PACU Anesthesia Type: General Level of consciousness: awake and alert Pain management: pain level controlled Vital Signs Assessment: post-procedure vital signs reviewed and stable Respiratory status: spontaneous breathing, nonlabored ventilation, respiratory function stable and patient connected to nasal cannula oxygen Cardiovascular status: blood pressure returned to baseline and stable Postop Assessment: no signs of nausea or vomiting Anesthetic complications: no    Last Vitals:  Filed Vitals:   10/19/15 1915 10/19/15 1917  BP: 156/94 156/94  Pulse: 97 97  Temp:  36.4 C  Resp:      Last Pain:  Filed Vitals:   10/19/15 1919  PainSc: 3                  Kelly Sanders

## 2015-10-19 NOTE — Anesthesia Preprocedure Evaluation (Signed)
Anesthesia Evaluation  Patient identified by MRN, date of birth, ID band Patient awake    Reviewed: Allergy & Precautions, NPO status , Patient's Chart, lab work & pertinent test results  History of Anesthesia Complications (+) PONV, PROLONGED EMERGENCE and history of anesthetic complications  Airway Mallampati: II  TM Distance: >3 FB Neck ROM: Full    Dental no notable dental hx. (+) Dental Advisory Given, Teeth Intact   Pulmonary neg pulmonary ROS,    Pulmonary exam normal breath sounds clear to auscultation       Cardiovascular Exercise Tolerance: Good hypertension, Pt. on medications Normal cardiovascular exam Rhythm:Regular Rate:Normal  CXR and ECG normal   Neuro/Psych Anxiety negative neurological ROS     GI/Hepatic negative GI ROS, Neg liver ROS,   Endo/Other  negative endocrine ROS  Renal/GU negative Renal ROS  negative genitourinary   Musculoskeletal  (+) Arthritis ,   Abdominal   Peds negative pediatric ROS (+)  Hematology negative hematology ROS (+)   Anesthesia Other Findings   Reproductive/Obstetrics negative OB ROS                             Anesthesia Physical Anesthesia Plan  ASA: II  Anesthesia Plan: General   Post-op Pain Management:    Induction: Intravenous  Airway Management Planned: LMA  Additional Equipment:   Intra-op Plan:   Post-operative Plan:   Informed Consent:   Plan Discussed with: Surgeon  Anesthesia Plan Comments:         Anesthesia Quick Evaluation

## 2015-10-19 NOTE — H&P (Signed)
Kelly Sanders is an 54 y.o. male.   Chief Complaint: RIGHT ELBOW BONY PROMINENCE AND PAIN HPI: PT SEEN/EVALUAED IN OFFICE FOR SEVERAL YEARS, PT HERE FOR SURGERY ON RIGHT ELBOW TO REMOVE BONY PROMINENCE AND SWELLING NO PRIOR SURGERY TO RIGHT ELBOW  Past Medical History  Diagnosis Date  . Anxiety   . Hypertension   . Hyperlipidemia   . Arthritis     osteoarthritis  . Complication of anesthesia     slow to wake   . PONV (postoperative nausea and vomiting)     Past Surgical History  Procedure Laterality Date  . Shoulder arthroscopy      Bi-lateral  . Left hand fibroid tumor removal    . Colonscopy      January 2015   . Total hip arthroplasty Left 11/15/2014    Procedure: LEFT TOTAL HIP ARTHROPLASTY ANTERIOR APPROACH;  Surgeon: Shelda PalMatthew D Olin, MD;  Location: [facility];  Service: Orthopedics;  Laterality: Left;    Family History  Problem Relation Age of Onset  . Cancer Mother   . Hypertension Mother   . Cancer Father   . Colon cancer Neg Hx   . Prostate cancer Neg Hx   . Esophageal cancer Neg Hx   . Rectal cancer Neg Hx   . Stomach cancer Neg Hx    Social History:  reports that he has never smoked. He has never used smokeless tobacco. He reports that he drinks alcohol. He reports that he does not use illicit drugs.  Allergies:  Allergies  Allergen Reactions  . Biaxin [Clarithromycin] Rash  . Penicillins Rash  . Sulfa Antibiotics Rash    No prescriptions prior to admission    No results found for this or any previous visit (from the past 48 hour(s)). No results found.  ROS NO RECENT ILLNESSES OR HOSPITALIZATIONS  There were no vitals taken for this visit. Physical Exam  General Appearance:  Alert, cooperative, no distress, appears stated age  Head:  Normocephalic, without obvious abnormality, atraumatic  Eyes:  Pupils equal, conjunctiva/corneas clear,         Throat: Lips, mucosa, and tongue normal; teeth and gums normal  Neck: No visible masses     Lungs:    respirations unlabored  Chest Wall:  No tenderness or deformity  Heart:  Regular rate and rhythm,  Abdomen:   Soft, non-tender,         Extremities: RIGHT ELBOW PROMINENT OLECRANON TIP MILD SWELLING GOOD ELBOW FLEXION AND EXTENSION FINGERS WARM WELL PERFUSED GOOD WRIST AND FOREARM MOBILITY  Pulses: 2+ and symmetric  Skin: Skin color, texture, turgor normal, no rashes or lesions     Neurologic: Normal    Assessment/Plan RIGHT OLECRANON BURSITIS AND PROMINENT OLECRANON ENTHESOPHYTE  RIGHTE ELBOW OLECRANON BURSECTOMY AND BONE EXCISION  R/B/A DISCUSSED WITH PT IN OFFICE.  PT VOICED UNDERSTANDING OF PLAN CONSENT SIGNED DAY OF SURGERY PT SEEN AND EXAMINED PRIOR TO OPERATIVE PROCEDURE/DAY OF SURGERY SITE MARKED. QUESTIONS ANSWERED WILL GO HOME FOLLOWING SURGERY  WE ARE PLANNING SURGERY FOR YOUR UPPER EXTREMITY. THE RISKS AND BENEFITS OF SURGERY INCLUDE BUT NOT LIMITED TO BLEEDING INFECTION, DAMAGE TO NEARBY NERVES ARTERIES TENDONS, FAILURE OF SURGERY TO ACCOMPLISH ITS INTENDED GOALS, PERSISTENT SYMPTOMS AND NEED FOR FURTHER SURGICAL INTERVENTION. WITH THIS IN MIND WE WILL PROCEED. I HAVE DISCUSSED WITH THE PATIENT THE PRE AND POSTOPERATIVE REGIMEN AND THE DOS AND DON'TS. PT VOICED UNDERSTANDING AND INFORMED CONSENT SIGNED.  Sharma CovertORTMANN,Constant [place] W 10/19/2015, 12:52 PM

## 2015-10-20 ENCOUNTER — Encounter (HOSPITAL_COMMUNITY): Payer: Self-pay | Admitting: Orthopedic Surgery

## 2015-10-20 NOTE — Op Note (Signed)
NAME:  Kelly Sanders, Kelly Sanders             ACCOUNT NO.:  [id number]  MEDICAL RECORD NO.:  [id number]  LOCATION:  [facility]                         [facility]  PHYSICIAN:  Sharma CovertFred W. Jaislyn Blinn IV, M.D.DATE OF BIRTH:  03/06/1961  DATE OF PROCEDURE:  10/19/2015 DATE OF DISCHARGE:  10/19/2015                              OPERATIVE REPORT   PREOPERATIVE DIAGNOSES: 1. Right elbow olecranon bursitis. 2. Right elbow olecranon enthesophyte.  POSTOPERATIVE DIAGNOSES: 1. Right elbow olecranon bursitis. 2. Right elbow olecranon enthesophyte.  ATTENDING PHYSICIAN:  Sharma CovertFred W. Meric Joye, M.D., who scrubbed and present for the entire procedure.  ASSISTANT SURGEON:  None.  ANESTHESIA:  General via LMA.  SURGICAL PROCEDURE:  Right elbow olecranon bursectomy and partial excision of proximal tip of the olecranon, anterior.  SURGICAL INDICATIONS:  Kelly Sanders is a right-hand-dominant gentleman with persistent discomfort over the posterior aspect of the elbow for greater than 3 years.  The patient elected to undergo the above procedure.  Risks, benefits, and alternatives were discussed in detail with the patient and signed informed consent was obtained.  Risks include, but not limited to bleeding; infection; damage to nearby nerves, arteries, or tendons; loss of motion of wrist and digits; incomplete relief of symptoms; and need for further surgical intervention.  DESCRIPTION OF PROCEDURE:  The patient was properly identified in the preoperative holding area and marked with a permanent marker made on the right elbow to indicate the correct operative site.  The patient was brought back to the operating room, placed supine on the anesthesia room table.  General anesthesia was administered.  The patient tolerated this well.  A well-padded tourniquet was placed on the right brachium and sealed with 1000 drape.  Right upper extremity was then prepped and draped in normal sterile fashion.  Time-out was called.   The correct side was identified and the procedure then begun.  Attention then turned to the right elbow.  The arm was then brought across the chest. Curvilinear incision was made directly on the radial tip.  Dissection was carried down through the skin and subcutaneous tissue.  Limb was then elevated and tourniquet insufflated.  The skin flaps were then raised.  Bursectomy was then carried out circumferentially.  The wound was then thoroughly irrigated.  Following this, partial excision of proximal tip of the olecranon was then carried out with small rongeur and osteotome, this was then taken back to a stable rim and flattened down nicely.  The periosteal layer was then nicely reapproximated and closed with Monocryl suture.  The wound was irrigated.  The subcutaneous tissue was closed with Monocryl and skin closed with running 3-0 Prolene with simple Prolene sutures.  Adaptic dressing, sterile compressive bandage then applied.  A 10 mL of 0.25% Marcaine with epinephrine were then injected.  The patient was then placed in a long-arm splint, extubated, and taken to recovery room in good condition.  POSTPROCEDURE PLAN:  The patient was discharged to home, seen back in the office approximately 8 days for wound check, and transition to a padded elbow sleeve and sutures out around the 2-week mark.  Radiographs of the elbow, lateral radiographs at the first visit.     Sharma CovertFred W. Albertha Beattie  IV, M.D.     FWO/MEDQ  D:  10/19/2015  T:  10/19/2015  Job:  [id number]

## 2015-10-23 ENCOUNTER — Encounter: Payer: Self-pay | Admitting: Family Medicine

## 2015-10-23 ENCOUNTER — Ambulatory Visit (INDEPENDENT_AMBULATORY_CARE_PROVIDER_SITE_OTHER): Payer: 59 | Admitting: Family Medicine

## 2015-10-23 VITALS — BP 145/89 | HR 67 | Temp 97.8°F | Resp 16 | Ht 70.5 in | Wt 176.0 lb

## 2015-10-23 DIAGNOSIS — I10 Essential (primary) hypertension: Secondary | ICD-10-CM

## 2015-10-23 DIAGNOSIS — Z0000 Encounter for general adult medical examination without abnormal findings: Secondary | ICD-10-CM | POA: Diagnosis not present

## 2015-10-23 DIAGNOSIS — F411 Generalized anxiety disorder: Secondary | ICD-10-CM | POA: Diagnosis not present

## 2015-10-23 LAB — LIPID PANEL
CHOLESTEROL: 225 mg/dL — AB (ref 125–200)
HDL: 63 mg/dL (ref >=40)
LDL Cholesterol: 138 mg/dL — ABNORMAL HIGH (ref <130)
Total CHOL/HDL Ratio: 3.6 Ratio (ref <=5.0)
Triglycerides: 121 mg/dL (ref <150)
VLDL: 24 mg/dL (ref <30)

## 2015-10-23 MED ORDER — CLONAZEPAM 0.5 MG PO TABS: 0.2500 mg | ORAL_TABLET | Freq: Two times a day (BID) | ORAL | Status: DC | PRN

## 2015-10-23 MED ORDER — LOSARTAN POTASSIUM 50 MG PO TABS
25.0000 mg | ORAL_TABLET | Freq: Every day | ORAL | Status: DC
Start: 2015-10-23 — End: 2016-07-19

## 2015-10-23 NOTE — Progress Notes (Signed)
Subjective:    Patient ID: Kelly Sanders, male    DOB: 1961-01-23, 54 y.o.   MRN: [id number]  HPI This is a pleasant 54 yo male who presents today for CPE. He has had a stressful year. His father passed away this year from Parkinson's disease and he is going through a divorce.   Last CPE- last year PSA- last year, normal  Colonoscopy-2015, 10 year recall Tdap- 12/14 Flu- annual Dental- regular Eye- over due Exercise- swimming  Past Medical History  Diagnosis Date  . Anxiety   . Hypertension   . Hyperlipidemia   . Arthritis     osteoarthritis  . Complication of anesthesia     slow to wake   . PONV (postoperative nausea and vomiting)    Past Surgical History  Procedure Laterality Date  . Shoulder arthroscopy      Bi-lateral  . Left hand fibroid tumor removal    . Colonscopy      January 2015   . Total hip arthroplasty Left 11/15/2014    Procedure: LEFT TOTAL HIP ARTHROPLASTY ANTERIOR APPROACH;  Surgeon: Shelda Pal, MD;  Location: [facility];  Service: Orthopedics;  Laterality: Left;  . Olecranon bursectomy Right 10/19/2015    Procedure: RIGHT ELBOW OLEBRANON BURSITECTOMY AND OSTEOPHYTE EXCISION;  Surgeon: Bradly Bienenstock, MD;  Location: [facility];  Service: Orthopedics;  Laterality: Right;   Family History  Problem Relation Age of Onset  . Cancer Mother   . Hypertension Mother   . Cancer Father   . Colon cancer Neg Hx   . Prostate cancer Neg Hx   . Esophageal cancer Neg Hx   . Rectal cancer Neg Hx   . Stomach cancer Neg Hx    Social History  Substance Use Topics  . Smoking status: Never Smoker   . Smokeless tobacco: Never Used  . Alcohol Use: Yes     Comment: rare   Review of Systems  Constitutional: Negative.   HENT: Negative.   Eyes: Negative.   Respiratory: Negative.   Cardiovascular: Negative.   Gastrointestinal: Negative.   Endocrine: Negative.   Genitourinary: Negative.   Musculoskeletal: Negative.   Skin: Negative.   Allergic/Immunologic:  Negative.   Neurological: Negative.   Hematological: Negative.   Psychiatric/Behavioral: Positive for sleep disturbance (Frequent awakening). Negative for suicidal ideas. The patient is nervous/anxious (uses clonazapam several times a week with good results. ).       Objective:   Physical Exam Physical Exam  Constitutional: He is oriented to person, place, and time. He appears well-developed and well-nourished.  HENT:  Head: Normocephalic and atraumatic.  Right Ear: External ear normal.  Left Ear: External ear normal.  Nose: Nose normal.  Mouth/Throat: Oropharynx is clear and moist.  Eyes: Conjunctivae are normal. Pupils are equal, round, and reactive to light.  Neck: Normal range of motion. Neck supple.  Cardiovascular: Normal rate, regular rhythm, normal heart sounds and intact distal pulses.   Pulmonary/Chest: Effort normal and breath sounds normal.  Abdominal: Soft. Bowel sounds are normal. Hernia confirmed negative in the right inguinal area and confirmed negative in the left inguinal area.  Genitourinary: Testes normal and penis normal. Circumcised.  Musculoskeletal: Normal range of motion. He exhibits no edema or tenderness.       Cervical back: Normal.       Thoracic back: Normal.       Lumbar back: Normal.  Lymphadenopathy:    He has no cervical adenopathy.       Right:  No inguinal adenopathy present.       Left: No inguinal adenopathy present.  Neurological: He is alert and oriented to person, place, and time. He has normal reflexes.  Skin: Skin is warm and dry.  Psychiatric: He has a normal mood and affect. His behavior is normal. Judgment normal.  Vitals reviewed. BP 145/89 mmHg  Pulse 67  Temp(Src) 97.8 F (36.6 C)  Resp 16  Ht 5' 10.5" (1.791 m)  Wt 176 lb (79.833 kg)  BMI 24.89 kg/m2 Wt Readings from Last 3 Encounters:  10/23/15 176 lb (79.833 kg)  10/19/15 175 lb 6 oz (79.55 kg)  11/08/14 176 lb 2 oz (79.89 kg)   [facility] 10/23/2015  10/21/2014  Decreased Interest 0 0  Down, Depressed, Hopeless 0 0  PHQ - 2 Score 0 0      Assessment & Plan:  1. Annual physical exam - Encouraged regular exercise, good sleep hygiene, yoga/meditation/stretching - He had normal BMP, cbc earlier this month  2. Essential hypertension - Lipid panel - losartan (COZAAR) 50 MG tablet; Take 0.5 tablets (25 mg total) by mouth daily.  Dispense: 90 tablet; Refill: 1  3. Anxiety state - clonazePAM (KLONOPIN) 0.5 MG tablet; Take 0.5-1 tablets (0.25-0.5 mg total) by mouth 2 (two) times daily as needed for anxiety.  Dispense: 30 tablet; Refill: 1   Olean Reeeborah Alleen Kehm, FNP-BC  [facility], [facility]  10/23/2015 10:36 AM

## 2015-11-23 ENCOUNTER — Other Ambulatory Visit (HOSPITAL_COMMUNITY): Payer: Self-pay | Admitting: Ophthalmology

## 2015-11-23 DIAGNOSIS — H5702 Anisocoria: Secondary | ICD-10-CM

## 2015-12-08 ENCOUNTER — Ambulatory Visit (HOSPITAL_COMMUNITY): Payer: 59

## 2015-12-08 ENCOUNTER — Ambulatory Visit (HOSPITAL_COMMUNITY)
Admission: RE | Admit: 2015-12-08 | Discharge: 2015-12-08 | Disposition: A | Discharge status: Home / Self Care | Payer: 59 | Source: Ambulatory Visit | Attending: Ophthalmology | Admitting: Ophthalmology

## 2015-12-08 DIAGNOSIS — I639 Cerebral infarction, unspecified: Secondary | ICD-10-CM | POA: Insufficient documentation

## 2015-12-08 DIAGNOSIS — H5702 Anisocoria: Secondary | ICD-10-CM

## 2015-12-08 DIAGNOSIS — J320 Chronic maxillary sinusitis: Secondary | ICD-10-CM | POA: Diagnosis not present

## 2015-12-08 LAB — POCT I-STAT CREATININE: Creatinine, Ser: 1.2 mg/dL (ref 0.61–1.24)

## 2015-12-08 MED ORDER — GADOBENATE DIMEGLUMINE 529 MG/ML IV SOLN
20.0000 mL | Freq: Once | INTRAVENOUS | Status: AC | PRN
  Administered 2015-12-08: 16 mL via INTRAVENOUS

## 2016-05-20 ENCOUNTER — Telehealth: Payer: Self-pay

## 2016-05-20 DIAGNOSIS — F411 Generalized anxiety disorder: Secondary | ICD-10-CM

## 2016-05-20 MED ORDER — CLONAZEPAM 0.5 MG PO TABS: 0.2500 mg | ORAL_TABLET | Freq: Two times a day (BID) | ORAL | Status: DC | PRN

## 2016-05-20 NOTE — Telephone Encounter (Signed)
Clonazepam 0.5mg - #30 refilled with note to RTC for further refills- Needs new PCP-seen by Deboraha Sprangebbie Gessner NP

## 2016-05-24 NOTE — Telephone Encounter (Signed)
Called Rx in to pharm in case they did not receive it when done on 17th. Called pt to notify.

## 2016-05-24 NOTE — Telephone Encounter (Signed)
Patient called to follow up on refill. Prescription is no where to be found

## 2016-05-27 ENCOUNTER — Telehealth: Payer: Self-pay

## 2016-05-27 NOTE — Telephone Encounter (Signed)
Called Rx into pharmacy and cancelled original Rx from [facility]

## 2016-07-16 ENCOUNTER — Other Ambulatory Visit: Payer: Self-pay | Admitting: Family Medicine

## 2016-07-16 DIAGNOSIS — F411 Generalized anxiety disorder: Secondary | ICD-10-CM

## 2016-07-19 ENCOUNTER — Ambulatory Visit (INDEPENDENT_AMBULATORY_CARE_PROVIDER_SITE_OTHER): Payer: 59 | Admitting: Family Medicine

## 2016-07-19 VITALS — BP 140/80 | HR 82 | Temp 98.8°F | Resp 16 | Ht 71.0 in | Wt 178.0 lb

## 2016-07-19 DIAGNOSIS — Z113 Encounter for screening for infections with a predominantly sexual mode of transmission: Secondary | ICD-10-CM

## 2016-07-19 DIAGNOSIS — R748 Abnormal levels of other serum enzymes: Secondary | ICD-10-CM | POA: Diagnosis not present

## 2016-07-19 DIAGNOSIS — I10 Essential (primary) hypertension: Secondary | ICD-10-CM

## 2016-07-19 DIAGNOSIS — F411 Generalized anxiety disorder: Secondary | ICD-10-CM

## 2016-07-19 DIAGNOSIS — E785 Hyperlipidemia, unspecified: Secondary | ICD-10-CM

## 2016-07-19 MED ORDER — CLONAZEPAM 0.5 MG PO TABS: 0.2500 mg | ORAL_TABLET | Freq: Two times a day (BID) | ORAL | 5 refills | Status: DC | PRN

## 2016-07-19 MED ORDER — LOSARTAN POTASSIUM 50 MG PO TABS: 50.0000 mg | ORAL_TABLET | Freq: Every day | ORAL | 1 refills | Status: DC

## 2016-07-19 MED ORDER — TRAZODONE HCL 50 MG PO TABS: 25.0000 mg | ORAL_TABLET | Freq: Every evening | ORAL | 5 refills | Status: DC | PRN

## 2016-07-19 NOTE — Patient Instructions (Signed)
     IF you received an x-ray today, you will receive an invoice from [facility]. Please contact [facility] at [phone] with questions or concerns regarding your invoice.   IF you received labwork today, you will receive an invoice from [facility]. Please contact [facility] at [phone] with questions or concerns regarding your invoice.   Our billing staff will not be able to assist you with questions regarding bills from these companies.  You will be contacted with the lab results as soon as they are available. The fastest way to get your results is to activate your My Chart account. Instructions are located on the last page of this paperwork. If you have not heard from us regarding the results in 2 weeks, please contact this office.      
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
## 2016-07-22 ENCOUNTER — Encounter: Payer: Self-pay | Admitting: Family Medicine

## 2016-08-02 ENCOUNTER — Ambulatory Visit (INDEPENDENT_AMBULATORY_CARE_PROVIDER_SITE_OTHER): Payer: 59 | Admitting: Family Medicine

## 2016-08-02 DIAGNOSIS — Z113 Encounter for screening for infections with a predominantly sexual mode of transmission: Secondary | ICD-10-CM

## 2016-08-02 DIAGNOSIS — I10 Essential (primary) hypertension: Secondary | ICD-10-CM

## 2016-08-02 DIAGNOSIS — E785 Hyperlipidemia, unspecified: Secondary | ICD-10-CM

## 2016-08-02 DIAGNOSIS — R748 Abnormal levels of other serum enzymes: Secondary | ICD-10-CM

## 2016-08-02 LAB — COMPREHENSIVE METABOLIC PANEL
ALBUMIN: 4.2 g/dL (ref 3.6–5.1)
ALT: 20 U/L (ref 9–46)
AST: 21 U/L (ref 10–35)
Alkaline Phosphatase: 83 U/L (ref 40–115)
BUN: 15 mg/dL (ref 7–25)
CHLORIDE: 102 mmol/L (ref 98–110)
CO2: 29 mmol/L (ref 20–31)
CREATININE: 1.13 mg/dL (ref 0.70–1.33)
Calcium: 9.6 mg/dL (ref 8.6–10.3)
GLUCOSE: 99 mg/dL (ref 65–99)
Potassium: 4.3 mmol/L (ref 3.5–5.3)
SODIUM: 144 mmol/L (ref 135–146)
Total Bilirubin: 1.1 mg/dL (ref 0.2–1.2)
Total Protein: 6.6 g/dL (ref 6.1–8.1)

## 2016-08-02 LAB — HEPATITIS C ANTIBODY: HCV AB: NEGATIVE

## 2016-08-02 LAB — LIPID PANEL
CHOL/HDL RATIO: 3.6 ratio (ref <=5.0)
Cholesterol: 223 mg/dL — ABNORMAL HIGH (ref 125–200)
HDL: 62 mg/dL (ref >=40)
LDL CALC: 144 mg/dL — AB (ref <130)
Triglycerides: 85 mg/dL (ref <150)
VLDL: 17 mg/dL (ref <30)

## 2016-08-02 LAB — GAMMA GT: GGT: 138 U/L — ABNORMAL HIGH (ref 7–51)

## 2016-08-05 NOTE — Progress Notes (Signed)
Labs only. Pt not seen

## 2016-08-08 ENCOUNTER — Encounter: Payer: Self-pay | Admitting: Family Medicine

## 2016-08-13 ENCOUNTER — Telehealth: Payer: Self-pay | Admitting: Family Medicine

## 2016-08-13 DIAGNOSIS — R748 Abnormal levels of other serum enzymes: Secondary | ICD-10-CM

## 2016-08-13 NOTE — Telephone Encounter (Signed)
Called pt.  His GGT sev mos ago with the life insurance was 136, on repeat here it was 138.  All other LFTs normal.  Reassured pt that this is likely just a variant of normal - [facility] states that GGT should only be checked in a work-up for abnormal LFTs and that if it is an isolated abnml, esp in the presence of a normal alk phos, an exhaustive work-up should NOT be done. However, pt is still concerned so will proceed with RUQ [place] and GI referral. If consult is more than a month away, then come by clinic for LAB-ONLY visit about 2 wks prior to have labs updated for visit.  Pt does need to restart on a statin but will hold off on initiating until after liver w/u is complete.

## 2016-08-13 NOTE — Telephone Encounter (Signed)
Pt wanting to know his results for his labs called 5 days ago he states he's worried about his GGT its been going up states that Dr.Shaw was suppose to get back with him about his results

## 2016-08-21 ENCOUNTER — Ambulatory Visit (INDEPENDENT_AMBULATORY_CARE_PROVIDER_SITE_OTHER): Payer: 59 | Admitting: Physician Assistant

## 2016-08-21 ENCOUNTER — Encounter: Payer: Self-pay | Admitting: Physician Assistant

## 2016-08-21 VITALS — BP 122/76 | HR 76 | Ht 71.0 in | Wt 174.2 lb

## 2016-08-21 DIAGNOSIS — R748 Abnormal levels of other serum enzymes: Secondary | ICD-10-CM

## 2016-08-21 NOTE — Patient Instructions (Signed)
You have been scheduled for [facility] on Monday, 08-26-16 at 11:30 am. Please arrive 15 minutes prior to your appointment for registration. Make certain not to have anything to eat or drink 6 hours prior to your appointment. Should you need to reschedule your appointment, please contact radiology at [phone]. This test typically takes about 30 minutes to perform.  Follow up will be determined based upon ultrasound findings.  If you are age 54 or older, your body mass index should be between 23-30. Your Body mass index is 24.3 kg/m. If this is out of the aforementioned range listed, please consider follow up with your Primary Care Provider.  If you are age 54 or younger, your body mass index should be between 19-25. Your Body mass index is 24.3 kg/m. If this is out of the aformentioned range listed, please consider follow up with your Primary Care Provider.

## 2016-08-21 NOTE — Progress Notes (Signed)
Chief Complaint: Elevated GGT  HPI:  [name] is a 55 year old Caucasian male, with past medical history of anxiety, arthritis, hyperlipidemia and hypertension, who was referred to me by Kelly Sanders, Kelly N, MD for a complaint of elevated GGT.  Most recent labs in our computer system are from 08/02/16 which showed GGT elevated at 138 (7-51) all other liver enzymes were normal, AST 21, ALT 20, alkaline phosphatase 83, total bilirubin 1.1. Hepatitis C antibody was also negative.  Today, the patient tells me that several months ago he had "free labs drawn at work", they told him then that his GGT was elevated at 136, almost 3 times normal and he was told to follow with his primary care provider. He did this and most recent labs are as above. The patient tells me that he does have a vague recollection of some elevated liver enzymes over 10 years ago, but never followed up on these and since then has not been told that his liver enzymes have been irregular at all. He does use clonazepam on a daily basis of the least 1 mg and has been using this for the past 3 weeks, which is greater than his typical 0.5 mg dose. He denies any other changes to his medication. He also denies any symptoms.  Patient denies fever, chills, blood in the stool, melena, weight loss, fatigue, anorexia, jaundice, pruritus, history of tattoos, IV drug use, family history of liver disease, change in bowel habits, nausea, vomiting, heartburn, reflux or abdominal pain.  Past Medical History:  Diagnosis Date  . Anxiety   . Arthritis    osteoarthritis  . Complication of anesthesia    slow to wake   . Hyperlipidemia   . Hypertension   . PONV (postoperative nausea and vomiting)     Past Surgical History:  Procedure Laterality Date  . colonscopy     January 2015   . ELBOW SURGERY Right 10/2015  . left hand fibroid tumor removal    . OLECRANON BURSECTOMY Right 10/19/2015   Procedure: RIGHT ELBOW OLEBRANON BURSITECTOMY AND  OSTEOPHYTE EXCISION;  Surgeon: Kelly BienenstockFred Ortmann, MD;  Location: [facility];  Service: Orthopedics;  Laterality: Right;  . SHOULDER ARTHROSCOPY     Bi-lateral  . TOTAL HIP ARTHROPLASTY Left 11/15/2014   Procedure: LEFT TOTAL HIP ARTHROPLASTY ANTERIOR APPROACH;  Surgeon: Kelly PalMatthew D Olin, MD;  Location: [facility];  Service: Orthopedics;  Laterality: Left;    Current Outpatient Prescriptions  Medication Sig Dispense Refill  . acyclovir (ZOVIRAX) 200 MG capsule Take 200 mg by mouth 2 (two) times daily as needed (for cold sore outbreak).   0  . clindamycin (CLEOCIN) 300 MG capsule Take 300 mg by mouth See admin instructions. Take 600mg  an hour before dental procedure    . clonazePAM (KLONOPIN) 0.5 MG tablet Take 0.5-1 tablets (0.25-0.5 mg total) by mouth 2 (two) times daily as needed for anxiety. 60 tablet 5  . ibuprofen (ADVIL,MOTRIN) 200 MG tablet Take 600 mg by mouth every 8 (eight) hours as needed (pain).    Marland Kitchen. losartan (COZAAR) 50 MG tablet Take 1 tablet (50 mg total) by mouth daily. 90 tablet 1  . traZODone (DESYREL) 50 MG tablet Take 0.5-1 tablets (25-50 mg total) by mouth at bedtime as needed for sleep. 30 tablet 5   No current facility-administered medications for this visit.     Allergies as of 08/21/2016 - Review Complete 08/21/2016  Allergen Reaction Noted  . Biaxin [clarithromycin] Rash 05/15/2012  . Penicillins Rash 05/15/2012  .  Sulfa antibiotics Rash 05/15/2012    Family History  Problem Relation Age of Onset  . Hypertension Mother   . Lung cancer Mother   . Parkinson's disease Father   . Colon cancer Neg Hx   . Prostate cancer Neg Hx   . Esophageal cancer Neg Hx   . Rectal cancer Neg Hx   . Stomach cancer Neg Hx   . Pancreatic cancer Neg Hx   . Liver disease Neg Hx     Social History   Social History  . Marital status: Legally Separated    Spouse name: Sanders/A  . Number of children: Sanders/A  . Years of education: Sanders/A   Occupational History  . System Analyst    Social History  Main Topics  . Smoking status: Never Smoker  . Smokeless tobacco: Never Used  . Alcohol use Yes     Comment: rare  . Drug use: No  . Sexual activity: Yes   Other Topics Concern  . Not on file   Social History Narrative   Married. Education: Visual merchandiser [employer]   Son born 20 daughter born 1998   2 caffeinated beverages daily    Review of Systems:     Constitutional: No weight loss, fever, chills, weakness or fatigue HEENT: Eyes: No change in vision               Ears, Nose, Throat:  No change in hearing or congestion Skin: No rash or itching Cardiovascular: No chest pain, chest pressure or palpitations   Respiratory: No SOB or cough Gastrointestinal: See HPI and otherwise negative Genitourinary: No dysuria or change in urinary frequency Neurological: No headache, dizziness or syncope Musculoskeletal: No new muscle or joint pain Hematologic: No bleeding or bruising Psychiatric: Positive for anxiety   Physical Exam:  Vital signs: BP 122/76   Pulse 76   Ht 5\' 11"  (1.803 m)   Wt 174 lb 3.2 oz (79 kg)   BMI 24.30 kg/m   General:   Pleasant Caucasian male appears to be in NAD, Well developed, Well nourished, alert and cooperative Head:  Normocephalic and atraumatic. Eyes:   PEERL, EOMI. No icterus. Conjunctiva pink. Ears:  Normal auditory acuity. Neck:  Supple Throat: Oral cavity and pharynx without inflammation, swelling or lesion.  Lungs: Respirations even and unlabored. Lungs clear to auscultation bilaterally.   No wheezes, crackles, or rhonchi.  Heart: Normal S1, S2. No MRG. Regular rate and rhythm. No peripheral edema, cyanosis or pallor.  Abdomen:  Soft, nondistended, nontender. No rebound or guarding. Normal bowel sounds. No appreciable masses or hepatomegaly. Rectal:  Not performed.  Msk:  Symmetrical without gross deformities. Peripheral pulses intact.  Extremities:  Without edema, no deformity or joint abnormality.  Normal ROM Neurologic:  Alert and  oriented x4;  grossly normal neurologically. Skin:   Dry and intact without significant lesions or rashes. Psychiatric: Oriented to person, place and time. Demonstrates good judgement and reason without abnormal affect or behaviors.  Most recent LABS AND see history of present illness CBC    Component Value Date/Time   WBC 5.8 10/19/2015 1435   RBC 4.79 10/19/2015 1435   HGB 15.4 10/19/2015 1435   HCT 44.5 10/19/2015 1435   PLT 246 10/19/2015 1435   MCV 92.9 10/19/2015 1435   MCH 32.2 10/19/2015 1435   MCHC 34.6 10/19/2015 1435   RDW 11.8 10/19/2015 1435    CMP     Component Value Date/Time   NA  144 08/02/2016 0925   K 4.3 08/02/2016 0925   CL 102 08/02/2016 0925   CO2 29 08/02/2016 0925   GLUCOSE 99 08/02/2016 0925   BUN 15 08/02/2016 0925   CREATININE 1.13 08/02/2016 0925   CALCIUM 9.6 08/02/2016 0925   PROT 6.6 08/02/2016 0925   ALBUMIN 4.2 08/02/2016 0925   AST 21 08/02/2016 0925   ALT 20 08/02/2016 0925   ALKPHOS 83 08/02/2016 0925   BILITOT 1.1 08/02/2016 0925   GFRNONAA >60 10/19/2015 1435   GFRNONAA 76 10/21/2014 1027   GFRAA >60 10/19/2015 1435   GFRAA 88 10/21/2014 1027    Assessment: 1. Isolated elevation of GGT: Apparently patient had an elevation of GGT approximately 2-3 months ago on a work physical at around 136, he was followed by his primary care provider and repeat labs were drawn last month with an elevated GGT at 138, he denies previous knowledge of any elevated liver enzymes recently, he has not changed medications, he is currently taking clonazepam and has increased his dose lately, question relation to abnormal GGT, hep C testing was done and negative; likely this is of no significance to the patient, will proceed with an ultrasound of the liver  Plan: 1. Discussed case with Dr. Christella Hartigan at the time of patient's appointment. Thought likely patient's medication was causing elevated GGT. Discussed this with the  patient. 2. We will order a right upper quadrant ultrasound of the liver for further evaluation. If this is negative, would recommend recheck of GGT in 3-4 months by his PCP, if no real change, can d/c further workup unless other liver enzymes elevated in the future 3. Patient to follow in clinic as directed after time of ultrasound.  Hyacinth Meeker, PA-C [facility] 08/21/2016, 12:08 PM  Cc: Kelly Mocha, MD

## 2016-08-22 NOTE — Progress Notes (Signed)
I agree with the above note, plan 

## 2016-08-26 ENCOUNTER — Ambulatory Visit (HOSPITAL_COMMUNITY)
Admission: RE | Admit: 2016-08-26 | Discharge: 2016-08-26 | Disposition: A | Discharge status: Home / Self Care | Payer: 59 | Source: Ambulatory Visit | Attending: Physician Assistant | Admitting: Physician Assistant

## 2016-08-26 ENCOUNTER — Telehealth: Payer: Self-pay

## 2016-08-26 DIAGNOSIS — I10 Essential (primary) hypertension: Secondary | ICD-10-CM

## 2016-08-26 DIAGNOSIS — K824 Cholesterolosis of gallbladder: Secondary | ICD-10-CM | POA: Diagnosis not present

## 2016-08-26 DIAGNOSIS — R748 Abnormal levels of other serum enzymes: Secondary | ICD-10-CM | POA: Insufficient documentation

## 2016-08-26 MED ORDER — LOSARTAN POTASSIUM 50 MG PO TABS: 50.0000 mg | ORAL_TABLET | Freq: Every day | ORAL | 1 refills | Status: DC

## 2016-08-26 NOTE — Telephone Encounter (Signed)
Patient is calling in regards to lasartan.  Dr. Clelia CroftShaw stated that he should be taking 1 tablet daily but the pharmaceutical notes still say 1/2 tablet daily.  Please advise!  Patient phone: [phone] [facility] [address]

## 2016-08-26 NOTE — Telephone Encounter (Signed)
Called pt and advised that when new Rx with inc dose was sent in on 9/15, [facility] [address]. [facility] on [address] probably filled an older RF. Verified w/pt that he is supposed to be taking a whole 50 mg tab QD and re-sent the correct Rx [facility] for RF in 15 days.

## 2017-01-20 ENCOUNTER — Other Ambulatory Visit: Payer: Self-pay | Admitting: Family Medicine

## 2017-01-20 DIAGNOSIS — F411 Generalized anxiety disorder: Secondary | ICD-10-CM

## 2017-01-22 NOTE — Telephone Encounter (Signed)
Please rereveiw

## 2017-01-22 NOTE — Telephone Encounter (Signed)
Please review and refill if appropriate. Deliah BostonMichael Rehana Uncapher, MS, PA-C 9:28 AM, 01/22/2017

## 2017-01-24 NOTE — Telephone Encounter (Signed)
rx faxed

## 2017-01-24 NOTE — Telephone Encounter (Signed)
Refilled, placed in to fax pile.

## 2017-02-01 ENCOUNTER — Other Ambulatory Visit: Payer: Self-pay | Admitting: Family Medicine

## 2017-02-01 NOTE — Telephone Encounter (Signed)
07/2016 last ov and refill 

## 2017-03-11 ENCOUNTER — Other Ambulatory Visit: Payer: Self-pay | Admitting: Physician Assistant

## 2017-03-13 ENCOUNTER — Other Ambulatory Visit: Payer: Self-pay | Admitting: Physician Assistant

## 2017-03-14 NOTE — Telephone Encounter (Signed)
mychart message sent to patient about making an appointment

## 2017-03-14 NOTE — Telephone Encounter (Signed)
Pt needs OV for any additional refills. He was due to f/u 6 mos ago with FASTING labs. Please see if pt would like to schedule within the next month.  If he wants to come in several days prior to an appt with me and get his fasting labs done, then we could have the results back to discuss at his appt that would be great - please let me know if he plans to do this so I can enter orders. If he sees another provider it will be up to them.

## 2017-03-15 NOTE — Telephone Encounter (Signed)
Duplicate - was refilled on the 5/8 message.

## 2017-03-22 ENCOUNTER — Encounter: Payer: Self-pay | Admitting: Family Medicine

## 2017-03-22 ENCOUNTER — Ambulatory Visit (INDEPENDENT_AMBULATORY_CARE_PROVIDER_SITE_OTHER): Payer: 59 | Admitting: Family Medicine

## 2017-03-22 VITALS — BP 113/68 | HR 57 | Temp 98.5°F | Resp 16 | Ht 71.0 in | Wt 166.8 lb

## 2017-03-22 DIAGNOSIS — E782 Mixed hyperlipidemia: Secondary | ICD-10-CM | POA: Diagnosis not present

## 2017-03-22 DIAGNOSIS — F411 Generalized anxiety disorder: Secondary | ICD-10-CM | POA: Diagnosis not present

## 2017-03-22 DIAGNOSIS — R634 Abnormal weight loss: Secondary | ICD-10-CM | POA: Diagnosis not present

## 2017-03-22 DIAGNOSIS — F5101 Primary insomnia: Secondary | ICD-10-CM

## 2017-03-22 DIAGNOSIS — H57051 Tonic pupil, right eye: Secondary | ICD-10-CM

## 2017-03-22 DIAGNOSIS — I10 Essential (primary) hypertension: Secondary | ICD-10-CM | POA: Diagnosis not present

## 2017-03-22 DIAGNOSIS — R6881 Early satiety: Secondary | ICD-10-CM | POA: Diagnosis not present

## 2017-03-22 DIAGNOSIS — Z125 Encounter for screening for malignant neoplasm of prostate: Secondary | ICD-10-CM

## 2017-03-22 HISTORY — DX: Tonic pupil, right eye: H57.051

## 2017-03-22 LAB — POC MICROSCOPIC URINALYSIS (UMFC): MUCUS RE: ABSENT

## 2017-03-22 LAB — POCT URINALYSIS DIP (MANUAL ENTRY)
BILIRUBIN UA: NEGATIVE
BILIRUBIN UA: NEGATIVE mg/dL
GLUCOSE UA: NEGATIVE mg/dL
Leukocytes, UA: NEGATIVE
Nitrite, UA: NEGATIVE
Protein Ur, POC: NEGATIVE mg/dL
Spec Grav, UA: >=1.03 — AB (ref 1.010–1.025)
UROBILINOGEN UA: 0.2 U/dL
pH, UA: 5 (ref 5.0–8.0)

## 2017-03-22 LAB — POCT GLYCOSYLATED HEMOGLOBIN (HGB A1C): Hemoglobin A1C: 5.5

## 2017-03-22 MED ORDER — TRAZODONE HCL 50 MG PO TABS: 50.0000 mg | ORAL_TABLET | Freq: Every day | ORAL | 3 refills | Status: DC

## 2017-03-22 MED ORDER — CLONAZEPAM 0.5 MG PO TABS: 0.2500 mg | ORAL_TABLET | Freq: Two times a day (BID) | ORAL | 3 refills | Status: DC | PRN

## 2017-03-22 NOTE — Progress Notes (Addendum)
Subjective:  By signing my name below, I, [name], attest that this documentation has been prepared under the direction and in the presence of [name], MD Electronically Signed: [name], ED Scribe 03/22/2017 at 9:00 AM.   Patient ID: Kelly Sanders, male    DOB: May 05, 1961, 56 y.o.   MRN: [id number]  Chief Complaint  Patient presents with  . Follow-up    medication recheck Losartan, Klonopin, PT FASTING    HPI Kelly Sanders is a 56 y.o. male who presents to [facility]. Last seen 8 months prior. Anxiety/insomina. On klonopin 0.5 #60/month for several years. during times of stress takes bid. Has failed Benadryl for sleep and tried some other meds prior as well. At last visit, encouraged pt to try trazodone for sleep. Pt is fasting at this visit.   HTN Checks BP outside of office. On Losartan which was increased from 25 to 50 mg at last visit. Pt denies side effects from Losartan. He reports occasional light-headedness and fatigue. Pt checks his BP at the pharmacy.   Hyperlipidemia  At last visit, LDL was noted to increased by 30 points with increase in ASCVD risk from 8.1 to 9%. Discussed starting statin which pt will be open to but was going to work on Kelly Sanders. He does exercise regularly. Advised to start Aspirin 81.   Pt had elevated GGT found at work by [employer] screen. Social alcohol use only with no alcohol overuse recently prior to lab work. No GI symptoms or prior h/o of abnormality. Recheck persisted with elevation. In July 2017, on life insurance screen was 136. Repeat was 138. Hep C screen was negative. UTD states that GGT should only be checked in a work-up for abnormal LFTs and that if it is an isolated abnml, esp in the presence of a normal alk phos, an exhaustive work-up should NOT be done. However, pt is still concerned so will proceed with RUQ [place] and GI referral. If consult is more than a month away, then come by clinic for LAB-ONLY visit about 2  wks prior to have labs updated for visit. Pt does need to restart on a statin but will hold off on initiating until after liver w/u is complete. Pt did not return to clinic for repeat lab check. Liver US was normal with incidental gallbladder polyp but no need for further eval unless liver enzymes increased per GI. GI suspected it was due to clonazepam and recheck every 3-4 months.   Insomnia  Pt states that he is sleeping a little better. He is able to sleep until 5 AM if he takes 1 trazodone.   Anxiety  Pt states that his anxiety has been going well overall and only uses Klonopin as needed. The max he uses Klonopin is 2 daily but states there are days that he doesn't take any. He states that his mother is currently going through another round of lung CA which has been a little rough on him, but overall he has been doing okay.   Weight Loss Pt has noticed a 12 lb weight loss over the past 8 months. He states that he has not a decreased appetite, stating he is full after having 2 bites of a hamburger but forces himself to continue to eat so he can gain weight. Pt does report intermittent light-headedness and fatigue with position changes. He also reports chest palpitations only with increased anxiety, which he states is a new issue. Pt has also reports  nocturia x 1-2 times a night. He denies night sweats, changes in hair or skin, changes in bowels, melena, heartburn, indigestion.   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 He followed up with an eye specialist and was told that symptoms would resolve after 2 years, however, he is still experiencing "star burst" in the right eye at night. Pt has not been evaluated by a neurologist.   Wt Readings from Last 3 Encounters:  03/22/17 166 lb 12.8 oz (75.7 kg)  08/21/16 174 lb 3.2 oz (79 kg)  07/19/16 178 lb (80.7 kg)   Past Medical History:  Diagnosis Date  . Anxiety   . Arthritis     osteoarthritis  . Complication of anesthesia    slow to wake   . Hyperlipidemia   . Hypertension   . PONV (postoperative nausea and vomiting)    Current Outpatient Prescriptions on File Prior to Visit  Medication Sig Dispense Refill  . acyclovir (ZOVIRAX) 200 MG capsule Take 200 mg by mouth 2 (two) times daily as needed (for cold sore outbreak).   0  . clindamycin (CLEOCIN) 300 MG capsule Take 300 mg by mouth See admin instructions. Take 668m an hour before dental procedure    . clonazePAM (KLONOPIN) 0.5 MG tablet Take 0.5-1 tablets (0.25-0.5 mg total) by mouth 2 (two) times daily as needed for anxiety. Needs visit for additional refills 60 tablet 1  . ibuprofen (ADVIL,MOTRIN) 200 MG tablet Take 600 mg by mouth every 8 (eight) hours as needed (pain).    .Marland Kitchenlosartan (COZAAR) 50 MG tablet Take 1 tablet (50 mg total) by mouth daily. 90 tablet 1  . traZODone (DESYREL) 50 MG tablet Take 0.5-1 tablets (25-50 mg total) by mouth at bedtime as needed for sleep. Need office visit for any refills. 30 tablet 0   No current facility-administered medications on file prior to visit.    Allergies  Allergen Reactions  . Biaxin [Clarithromycin] Rash  . Penicillins Rash    Has patient had a PCN reaction causing immediate rash, facial/tongue/throat swelling, SOB or lightheadedness with hypotension: No  Has patient had a PCN reaction causing severe rash involving mucus membranes or skin necrosis: No Has patient had a PCN reaction that required hospitalization: No  Has patient had a PCN reaction occurring within the last 10 years: No If all of the above answers are "NO", then may proceed with Cephalosporin use.   . Sulfa Antibiotics Rash   Past Surgical History:  Procedure Laterality Date  . colonscopy     January 2015   . ELBOW SURGERY Right 10/2015  . left hand fibroid tumor removal    . OLECRANON BURSECTOMY Right 10/19/2015   Procedure: RIGHT ELBOW OLEBRANON BURSITECTOMY AND OSTEOPHYTE EXCISION;   Surgeon: FIran Planas MD;  Location: [facility];  Service: Orthopedics;  Laterality: Right;  . SHOULDER ARTHROSCOPY     Bi-lateral  . TOTAL HIP ARTHROPLASTY Left 11/15/2014   Procedure: LEFT TOTAL HIP ARTHROPLASTY ANTERIOR APPROACH;  Surgeon: MMauri Pole MD;  Location: [facility];  Service: Orthopedics;  Laterality: Left;   Family History  Problem Relation Age of Onset  . Hypertension Mother   . Lung cancer Mother   . Parkinson's disease Father   . Colon cancer Neg Hx   . Prostate cancer Neg Hx   . Esophageal cancer Neg Hx   . Rectal cancer Neg Hx   . Stomach cancer Neg Hx   .  Pancreatic cancer Neg Hx   . Liver disease Neg Hx    Social History   Social History  . Marital status: Legally Separated    Spouse name: N/A  . Number of children: N/A  . Years of education: N/A   Occupational History  . System Analyst    Social History Main Topics  . Smoking status: Never Smoker  . Smokeless tobacco: Never Used  . Alcohol use Yes     Comment: rare  . Drug use: No  . Sexual activity: Yes   Other Topics Concern  . None   Social History Narrative   Married. Education: [employer]    Son born 57 daughter born 1998   2 caffeinated beverages daily   Depression screen [facility] 2/9 03/22/2017 07/19/2016 10/23/2015 10/21/2014  Decreased Interest 0 0 0 0  Down, Depressed, Hopeless 0 0 0 0  PHQ - 2 Score 0 0 0 0    Review of Systems  Constitutional: Positive for fatigue (intermittent) and unexpected weight change. Negative for diaphoresis.  Eyes: Positive for visual disturbance.  Cardiovascular: Positive for palpitations.  Gastrointestinal: Negative for blood in stool and diarrhea.  Skin: Negative.   Neurological: Positive for light-headedness (intermittent).  Psychiatric/Behavioral: Positive for sleep disturbance. The patient is nervous/anxious (intermittent).       Objective:   Physical Exam  Constitutional: He is oriented to  person, place, and time. He appears well-developed and well-nourished. No distress.  HENT:  Head: Normocephalic and atraumatic.  Eyes: Conjunctivae and EOM are normal.  Neck: Neck supple. No tracheal deviation present. No thyromegaly present.  Cardiovascular: Normal rate, regular rhythm, S1 normal, S2 normal and normal heart sounds.   Pulmonary/Chest: Effort normal and breath sounds normal. No respiratory distress.  Abdominal: Soft. Bowel sounds are normal. He exhibits no distension. There is no hepatosplenomegaly. There is no tenderness. There is negative Murphy's sign.  Musculoskeletal: Normal range of motion.  Lymphadenopathy:    He has no cervical adenopathy.  Neurological: He is alert and oriented to person, place, and time. He has normal reflexes.  2+ reflexes throughout. Occasional 1+ on R patellar.  Skin: Skin is warm and dry.  Psychiatric: He has a normal mood and affect. His behavior is normal.  Nursing note and vitals reviewed.  BP 113/68 (BP Location: Left Arm, Patient Position: Sitting, Cuff Size: Small)   Pulse (!) 57   Temp 98.5 F (36.9 C) (Oral)   Resp 16   Ht 5' 11" (1.803 m)   Wt 166 lb 12.8 oz (75.7 kg)   SpO2 98%   BMI 23.26 kg/m     Results for orders placed or performed in visit on 03/22/17  POCT glycosylated hemoglobin (Hb A1C)  Result Value Ref Range   Hemoglobin A1C 5.5   POCT urinalysis dipstick  Result Value Ref Range   Color, UA yellow yellow   Clarity, UA clear clear   Glucose, UA negative negative mg/dL   Bilirubin, UA negative negative   Ketones, POC UA negative negative mg/dL   Spec Grav, UA >=1.030 (A) 1.010 - 1.025   Blood, UA trace-intact (A) negative   pH, UA 5.0 5.0 - 8.0   Protein Ur, POC negative negative mg/dL   Urobilinogen, UA 0.2 0.2 or 1.0 E.U./dL   Nitrite, UA Negative Negative   Leukocytes, UA Negative Negative  POCT Microscopic Urinalysis ([facility])  Result Value Ref Range   WBC,UR,HPF,POC None None WBC/hpf   RBC,UR,HPF,POC  None  None RBC/hpf   Bacteria Few (A) None, Too numerous to count   Mucus Absent Absent   Epithelial Cells, UR Per Microscopy None None, Too numerous to count cells/hpf   Calcium Oxalate Crystal few    EKG reading done by [name], MD: Normal sinus rhythm. No ischemic changes.   Assessment & Plan:   1. Essential hypertension - VERY well controlled on current regimen - poss that he is having some HYPOtensive sxs so if worsen many need to decrease losartan back to 40m again.  2. Unintentional weight loss - could be due to worsneing stress and anxiety but will do detailed lab w/u below to look for other causes. If negative/normal, consider trial of mirtazapine qhs  3. Early satiety - if lab w/u normal, see if resolves or responds to anxiety treatment, if not will need abd/pelvic CT and GI referral  4. Primary insomnia - doing great on 1013mtrazodone qhs though sometimes take 5029mnd repeats when awakens in middle of night, continue.  5. Adie's tonic pupil, right - rec f/u with optho - there are potential gtts to trigger constriction since he is still symptomatic (though these can have a fairly unpleasent potential side effect list).  I am happy to refer him to neurology for a further eval to ensure no secondary causes if he prefers but I do not see anything to suggest that on exam today so agrees to await additional lab w/u.  6. Anxiety state - stable on klonopin 0.5mg57mt lots of stressors lately so may need to consider adding in a low dose anti-depressent.  7. Screening for prostate cancer   8. Mixed hyperlipidemia - recheck today and start statin if still elev    Orders Placed This Encounter  Procedures  . CBC with Differential/Platelet  . Comprehensive metabolic panel  . TSH  . HIV antibody  . RPR  . Vitamin B12  . VITAMIN D 25 Hydroxy (Vit-D Deficiency, Fractures)  . Ferritin  . Sedimentation Rate  . C-reactive protein  . Gamma GT  . PSA  . Lipid panel    Order Specific  Question:   Has the patient fasted?    Answer:   Yes  . POC Hemoccult Bld/Stl (3-Cd Home Screen)    Standing Status:   Future    Standing Expiration Date:   03/22/2018  . POCT glycosylated hemoglobin (Hb A1C)  . POCT urinalysis dipstick  . POCT Microscopic Urinalysis ([facility])  . EKG 12-Lead    Meds ordered this encounter  Medications  . clonazePAM (KLONOPIN) 0.5 MG tablet    Sig: Take 0.5-1 tablets (0.25-0.5 mg total) by mouth 2 (two) times daily as needed for anxiety.    Dispense:  60 tablet    Refill:  3    Please cancel prior rxs on file.  . traZODone (DESYREL) 50 MG tablet    Sig: Take 1-2 tablets (50-100 mg total) by mouth at bedtime. Need office visit for any refills.    Dispense:  180 tablet    Refill:  3   Over 40 min spent in face-to-face evaluation of and consultation with patient and coordination of care.  Over 50% of this time was spent counseling this patient.  I personally performed the services described in this documentation, which was scribed in my presence. The recorded information has been reviewed and considered, and addended by me as needed.   Eva Delman CheadleD.  [facility] [place]  [phone] phone [phone] fax  03/22/17 6:36 PM

## 2017-03-22 NOTE — Patient Instructions (Addendum)
IF you received an x-ray today, you will receive an invoice from [facility]. Please contact [facility] at [phone] with questions or concerns regarding your invoice.   IF you received labwork today, you will receive an invoice from [facility]. Please contact [facility] at [phone] with questions or concerns regarding your invoice.   Our billing staff will not be able to assist you with questions regarding bills from these companies.  You will be contacted with the lab results as soon as they are available. The fastest way to get your results is to activate your My Chart account. Instructions are located on the last page of this paperwork. If you have not heard from [place] regarding the results in 2 weeks, please contact this office.      Failure to Thrive, Adult Failure to thrive is a group of symptoms that affect elderly adults. These symptoms include loss of appetite and weight loss. People who have this condition may do fewer and fewer activities over time. They may lose interest in being with friends or they may not want to eat or drink. This condition is not a normal part of aging. What are the causes? This condition may be caused by:  A disease, such dementia, diabetes, cancer, or lung disease.  A health problem, such as a vitamin deficiency or a heart problem.  A disorder, such as depression.  A disability.  Medicines.  Mistreatment or neglect. In some cases, the cause may not be known. What are the signs or symptoms? Symptoms of this condition include:  Loss of more than 5% of your body weight.  Being more tired than normal after an activity.  Having trouble getting up after sitting.  Loss of appetite.  Not getting out of bed.  Not wanting to do usual activities.  Depression.  Getting infections often.  Bedsores.  Taking a long time to recover after an injury or a surgery.  Weakness. How is this diagnosed? This condition may be  diagnosed with a physical exam. Your health care provider will ask questions about your health, behavior, and mood, such as:  Has your activity changed?  Do you seem sad?  Are your eating habits different? Tests may also be done. They may include:  Blood tests.  Urine tests.  Imaging tests, such as X-rays, a CT scan, or MRI.  Hearing tests.  Vision tests.  Tests to check thinking ability (cognitive tests).  Activity tests to see if you can do tasks such as bathing and dressing and to see if you can move around safely. You may be referred to a specialist. How is this treated? Treatment for this condition depends on the cause. It may involve:  Treating the cause.  Talk therapy or medicine to treat depression.  Improving diet, such as by eating more often or taking nutritional supplements.  Changing or stopping a medicine.  Physical therapy. It often takes a team of health care providers to find the right treatment. Follow these instructions at home:  Take over-the-counter and prescription medicines only as told by your health care provider.  Eat a healthy, well-balanced diet. Make sure to get enough calories in each meal.  Be physically active. Include strength training as part of your exercise routine. A physical therapist can help to set up an exercise program that fits you.  Make sure that you are safe at home.  Make sure that you have a plan for what to do if you become unable to make  decisions for yourself. Contact a health care provider if:  You are not able to eat well.  You are not able to move around.  You feel very sad or hopeless. Get help right away if:  You have thoughts of ending your life.  You cannot eat or drink.  You do not get out of bed.  Staying at home is no longer safe.  You have a fever. This information is not intended to replace advice given to you by your health care provider. Make sure you discuss any questions you have with  your health care provider. Document Released: 01/13/2012 Document Revised: 03/28/2016 Document Reviewed: 01/16/2015 Elsevier Interactive Patient Education  2017 [employer]

## 2017-03-24 LAB — COMPREHENSIVE METABOLIC PANEL
ALBUMIN: 4.8 g/dL (ref 3.5–5.5)
ALK PHOS: 116 IU/L (ref 39–117)
ALT: 37 IU/L (ref 0–44)
AST: 39 IU/L (ref 0–40)
Albumin/Globulin Ratio: 1.9 (ref 1.2–2.2)
BILIRUBIN TOTAL: 0.5 mg/dL (ref 0.0–1.2)
BUN/Creatinine Ratio: 13 (ref 9–20)
BUN: 16 mg/dL (ref 6–24)
CO2: 26 mmol/L (ref 18–29)
CREATININE: 1.24 mg/dL (ref 0.76–1.27)
Calcium: 10 mg/dL (ref 8.7–10.2)
Chloride: 104 mmol/L (ref 96–106)
GFR calc Af Amer: 76 mL/min/{1.73_m2} (ref >59)
GFR, EST NON AFRICAN AMERICAN: 65 mL/min/{1.73_m2} (ref >59)
GLOBULIN, TOTAL: 2.5 g/dL (ref 1.5–4.5)
GLUCOSE: 96 mg/dL (ref 65–99)
POTASSIUM: 4.5 mmol/L (ref 3.5–5.2)
Sodium: 145 mmol/L — ABNORMAL HIGH (ref 134–144)
Total Protein: 7.3 g/dL (ref 6.0–8.5)

## 2017-03-24 LAB — CBC WITH DIFFERENTIAL/PLATELET
BASOS ABS: 0 10*3/uL (ref 0.0–0.2)
Basos: 1 %
EOS (ABSOLUTE): 0.1 10*3/uL (ref 0.0–0.4)
Eos: 2 %
HEMATOCRIT: 49.7 % (ref 37.5–51.0)
HEMOGLOBIN: 16.6 g/dL (ref 13.0–17.7)
Immature Grans (Abs): 0 10*3/uL (ref 0.0–0.1)
Immature Granulocytes: 0 %
LYMPHS ABS: 1.3 10*3/uL (ref 0.7–3.1)
Lymphs: 22 %
MCH: 32 pg (ref 26.6–33.0)
MCHC: 33.4 g/dL (ref 31.5–35.7)
MCV: 96 fL (ref 79–97)
MONOCYTES: 9 %
Monocytes Absolute: 0.5 10*3/uL (ref 0.1–0.9)
NEUTROS ABS: 3.8 10*3/uL (ref 1.4–7.0)
Neutrophils: 66 %
Platelets: 266 10*3/uL (ref 150–379)
RBC: 5.19 x10E6/uL (ref 4.14–5.80)
RDW: 12.9 % (ref 12.3–15.4)
WBC: 5.8 10*3/uL (ref 3.4–10.8)

## 2017-03-24 LAB — FERRITIN: Ferritin: 217 ng/mL (ref 30–400)

## 2017-03-24 LAB — GAMMA GT: GGT: 155 IU/L — AB (ref 0–65)

## 2017-03-24 LAB — RPR: RPR Ser Ql: NONREACTIVE

## 2017-03-24 LAB — HIV ANTIBODY (ROUTINE TESTING W REFLEX): HIV SCREEN 4TH GENERATION: NONREACTIVE

## 2017-03-24 LAB — VITAMIN B12: VITAMIN B 12: 467 pg/mL (ref 232–1245)

## 2017-03-24 LAB — SEDIMENTATION RATE: Sed Rate: 2 mm/hr (ref 0–30)

## 2017-03-24 LAB — VITAMIN D 25 HYDROXY (VIT D DEFICIENCY, FRACTURES): VIT D 25 HYDROXY: 30.4 ng/mL (ref 30.0–100.0)

## 2017-03-24 LAB — PSA: Prostate Specific Ag, Serum: 0.3 ng/mL (ref 0.0–4.0)

## 2017-03-24 LAB — TSH: TSH: 2.21 u[IU]/mL (ref 0.450–4.500)

## 2017-03-24 LAB — C-REACTIVE PROTEIN: CRP: 0.4 mg/L (ref 0.0–4.9)

## 2017-03-27 ENCOUNTER — Other Ambulatory Visit: Payer: Self-pay | Admitting: Family Medicine

## 2017-03-27 DIAGNOSIS — I10 Essential (primary) hypertension: Secondary | ICD-10-CM

## 2017-04-02 ENCOUNTER — Other Ambulatory Visit: Payer: Self-pay | Admitting: Family Medicine

## 2017-04-02 DIAGNOSIS — E782 Mixed hyperlipidemia: Secondary | ICD-10-CM

## 2017-04-03 ENCOUNTER — Other Ambulatory Visit (INDEPENDENT_AMBULATORY_CARE_PROVIDER_SITE_OTHER): Payer: 59 | Admitting: Physician Assistant

## 2017-04-03 DIAGNOSIS — R634 Abnormal weight loss: Secondary | ICD-10-CM | POA: Diagnosis not present

## 2017-04-03 DIAGNOSIS — E782 Mixed hyperlipidemia: Secondary | ICD-10-CM

## 2017-04-03 LAB — POC HEMOCCULT BLD/STL (HOME/3-CARD/SCREEN)
Card #2 Fecal Occult Blod, POC: NEGATIVE
FECAL OCCULT BLD: NEGATIVE
Fecal Occult Blood, POC: NEGATIVE

## 2017-04-03 NOTE — Progress Notes (Signed)
Lab only 

## 2017-04-04 LAB — LIPID PANEL
CHOL/HDL RATIO: 3.5 ratio (ref 0.0–5.0)
CHOLESTEROL TOTAL: 198 mg/dL (ref 100–199)
HDL: 57 mg/dL (ref >39)
LDL Calculated: 119 mg/dL — ABNORMAL HIGH (ref 0–99)
Triglycerides: 110 mg/dL (ref 0–149)
VLDL Cholesterol Cal: 22 mg/dL (ref 5–40)

## 2017-04-04 LAB — SPECIMEN STATUS REPORT

## 2017-04-14 ENCOUNTER — Encounter: Payer: Self-pay | Admitting: Family Medicine

## 2017-06-23 ENCOUNTER — Other Ambulatory Visit: Payer: Self-pay | Admitting: Family Medicine

## 2017-06-23 DIAGNOSIS — F411 Generalized anxiety disorder: Secondary | ICD-10-CM

## 2017-06-27 ENCOUNTER — Encounter: Payer: Self-pay | Admitting: Family Medicine

## 2017-06-27 NOTE — Telephone Encounter (Signed)
Please call pt and let him know that he just called in a refill on the wrong bottle of klonopin - an old rx number likely.  I wrote him a rx for klonopin in May that had 3 refills on it. [facility] and hasn't used ANY of the refills. The refills will be available to him for 6 months from the date the rx was written. In mid-November, I will need to see him in an OV for a new rx for the clonazepam

## 2017-08-19 ENCOUNTER — Encounter (HOSPITAL_COMMUNITY): Payer: Self-pay | Admitting: Emergency Medicine

## 2017-08-19 ENCOUNTER — Ambulatory Visit (HOSPITAL_COMMUNITY)
Admission: EM | Admit: 2017-08-19 | Discharge: 2017-08-19 | Disposition: A | Discharge status: Home / Self Care | Payer: 59 | Attending: Family Medicine | Admitting: Family Medicine

## 2017-08-19 DIAGNOSIS — R300 Dysuria: Secondary | ICD-10-CM | POA: Diagnosis not present

## 2017-08-19 DIAGNOSIS — N3001 Acute cystitis with hematuria: Secondary | ICD-10-CM | POA: Diagnosis not present

## 2017-08-19 DIAGNOSIS — R350 Frequency of micturition: Secondary | ICD-10-CM | POA: Diagnosis not present

## 2017-08-19 LAB — POCT URINALYSIS DIP (DEVICE)
Glucose, UA: NEGATIVE mg/dL
Ketones, ur: 80 mg/dL — AB
NITRITE: POSITIVE — AB
PH: 5.5 (ref 5.0–8.0)
PROTEIN: 30 mg/dL — AB
Specific Gravity, Urine: 1.025 (ref 1.005–1.030)
Urobilinogen, UA: 1 mg/dL (ref 0.0–1.0)

## 2017-08-19 MED ORDER — CIPROFLOXACIN HCL 500 MG PO TABS: 500.0000 mg | ORAL_TABLET | Freq: Two times a day (BID) | ORAL | 0 refills | Status: DC

## 2017-08-19 NOTE — ED Triage Notes (Signed)
PT reports he started with a fever Sunday night. PT reports he then started experiencing urinary frequency, burning, and pressure. PT also reports low back pain and LLQ pain as well.

## 2017-08-19 NOTE — ED Provider Notes (Signed)
[facility]    ASSESSMENT & PLAN:  1. Acute cystitis with hematuria    Meds ordered this encounter  Medications  . ciprofloxacin (CIPRO) 500 MG tablet    Sig: Take 1 tablet (500 mg total) by mouth 2 (two) times daily.    Dispense:  14 tablet    Refill:  0   Urine culture sent. Will notify patient of any positive results. Will follow up with his PCP or here if not showing improvement over the next 48 hours, sooner if needed.  Outlined signs and symptoms indicating need for more acute intervention. Patient verbalized understanding. After Visit Summary given.  SUBJECTIVE:  Kelly Sanders is a 56 y.o. male who complains of urinary frequency, urgency and dysuria for the past few days. No flank pain, chills. Questions subjective fever. Hematuria: not present. Normal PO intake. No flank or abdominal pain. No self treatment. ROS: As in HPI.  OBJECTIVE:  Vitals:   08/19/17 1228 08/19/17 1230  BP:  129/74  Pulse:  89  Resp:  16  Temp:  98.9 F (37.2 C)  TempSrc:  Oral  SpO2:  100%  Weight: 170 lb (77.1 kg)   Height:  (1.803 m)    Appears well, in no apparent distress. Abdomen is soft without tenderness, guarding, mass, rebound or organomegaly. No CVA tenderness or inguinal adenopathy noted.  Labs Reviewed  POCT URINALYSIS DIP (DEVICE) - Abnormal; Notable for the following:       Result Value   Bilirubin Urine SMALL (*)    Ketones, ur 80 (*)    Hgb urine dipstick MODERATE (*)    Protein, ur 30 (*)    Nitrite POSITIVE (*)    Leukocytes, UA SMALL (*)    All other components within normal limits  URINE CULTURE    Allergies  Allergen Reactions  . Biaxin [Clarithromycin] Rash  . Penicillins Rash    Has patient had a PCN reaction causing immediate rash, facial/tongue/throat swelling, SOB or lightheadedness with hypotension: No  Has patient had a PCN reaction causing severe rash involving mucus membranes or skin necrosis: No Has patient had a PCN  reaction that required hospitalization: No  Has patient had a PCN reaction occurring within the last 10 years: No If all of the above answers are "NO", then may proceed with Cephalosporin use.   . Sulfa Antibiotics Rash    Past Medical History:  Diagnosis Date  . Anxiety   . Arthritis    osteoarthritis  . Complication of anesthesia    slow to wake   . Hyperlipidemia   . Hypertension   . PONV (postoperative nausea and vomiting)    Social History   Social History  . Marital status: Legally Separated    Spouse name: N/A  . Number of children: N/A  . Years of education: N/A   Occupational History  . System Analyst    Social History Main Topics  . Smoking status: Never Smoker  . Smokeless tobacco: Never Used  . Alcohol use Yes     Comment: rare  . Drug use: No  . Sexual activity: Yes   Other Topics Concern  . Not on file   Social History Narrative   Married. Education: Banker   [employer]   Son born 55 daughter born 1998   2 caffeinated beverages daily   Family History  Problem Relation Age of Onset  . Hypertension Mother   . Lung cancer Mother   .  Parkinson's disease Father   . Colon cancer Neg Hx   . Prostate cancer Neg Hx   . Esophageal cancer Neg Hx   . Rectal cancer Neg Hx   . Stomach cancer Neg Hx   . Pancreatic cancer Neg Hx   . Liver disease Neg Hx        Mardella Layman, MD 08/19/17 1259

## 2017-08-21 LAB — URINE CULTURE

## 2017-09-27 IMAGING — MR MR HEAD WO/W CM
15 of 20 series · 35 of 48 positions shown · IV contrast (Yes)
Comparison: None.

CLINICAL DATA: Anisocoria.

EXAM:
MRI HEAD AND ORBITS WITHOUT AND WITH CONTRAST
TECHNIQUE: Multiplanar, multiecho pulse sequences of the brain and surrounding
structures were obtained without and with intravenous contrast.
Multiplanar, multiecho pulse sequences of the orbits and surrounding
structures were obtained including fat saturation techniques, before
and after intravenous contrast administration.
CONTRAST:  16mL MULTIHANCE GADOBENATE DIMEGLUMINE 529 MG/ML IV SOLN

[Series 3: T1 · sagittal · 5.0mm · 0.47mm/px · 1 of 24 slices shown]
[im 1/24]
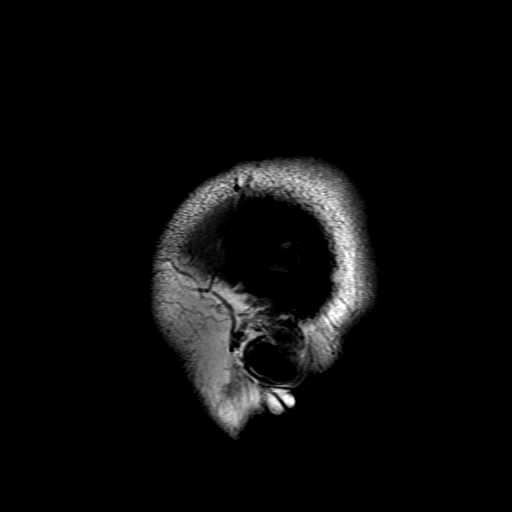

[Series 4: DWI · axial · 3.0mm · 1.09mm/px · z∈[-30,+123]mm · 7 of 104 slices shown (1 of 6)]
[im 1/104]
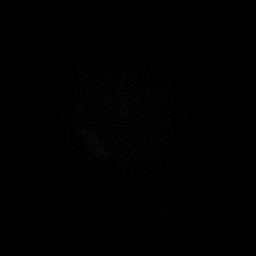
[im 18/104]
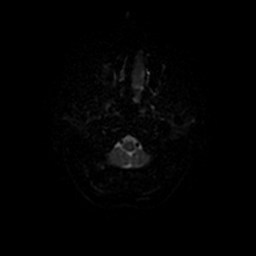
[im 35/104]
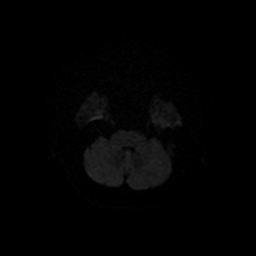
[im 52/104]
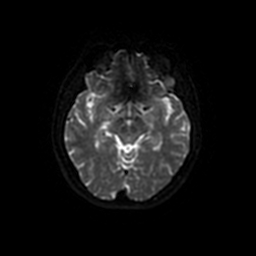
[im 69/104]
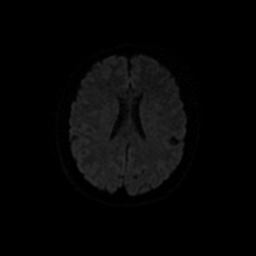
[im 86/104]
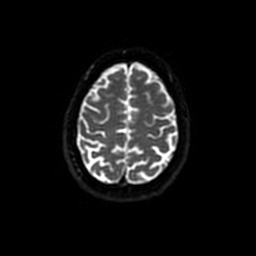
[im 104/104]
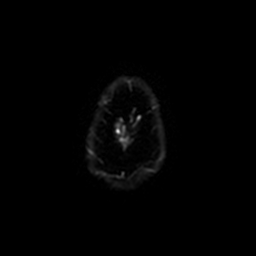

[Series 5: DWI · coronal · 5.0mm · 1.09mm/px · 5 of 68 slices shown (2 of 6)]
[im 1/68]
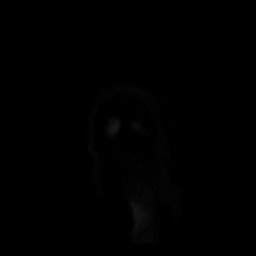
[im 17/68]
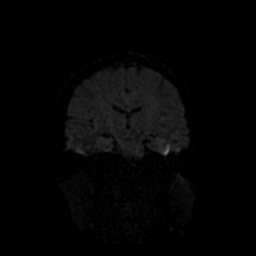
[im 34/68]
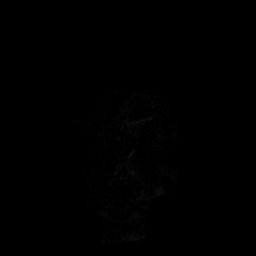
[im 51/68]
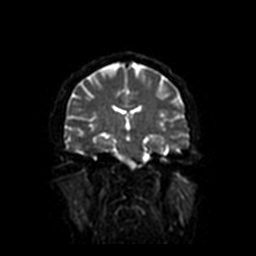
[im 68/68]
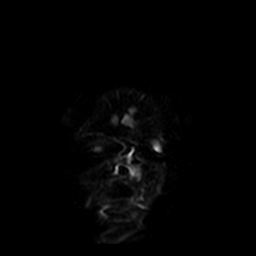

[Series 6: DWI · axial · 2.0mm · 1.09mm/px · z∈[-5,+75]mm · 5 of 82 slices shown (3 of 6)]
[im 1/82]
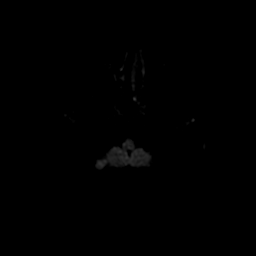
[im 21/82]
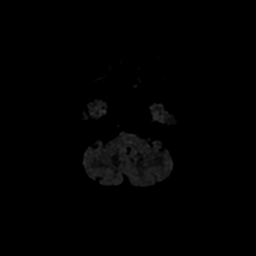
[im 41/82]
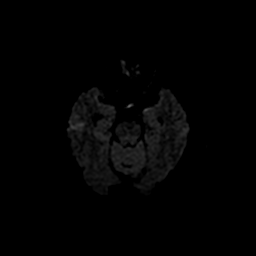
[im 61/82]
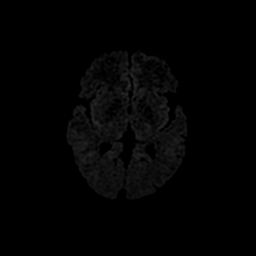
[im 82/82]
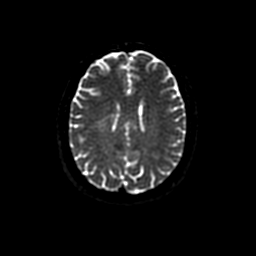

[Series 7: T2 · axial · 5.0mm · 0.43mm/px · 1 of 24 slices shown]
[im 1/24]
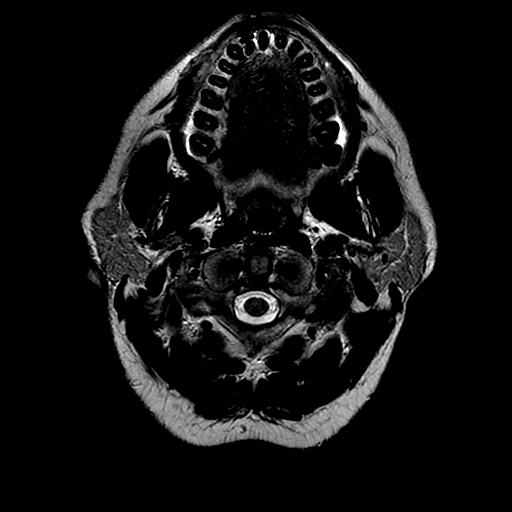

[Series 8: FLAIR · axial · 5.0mm · 0.43mm/px · 1 of 24 slices shown]
[im 1/24]
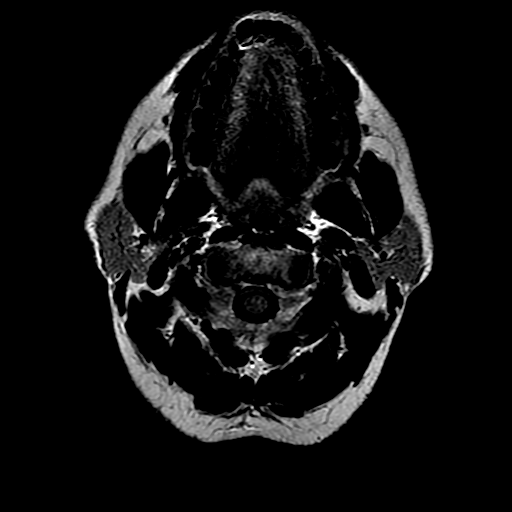

[Series 11: T2 fat-sat · axial · 3.0mm · 0.35mm/px · 1 of 22 slices shown (1 of 2)]
[im 1/22]
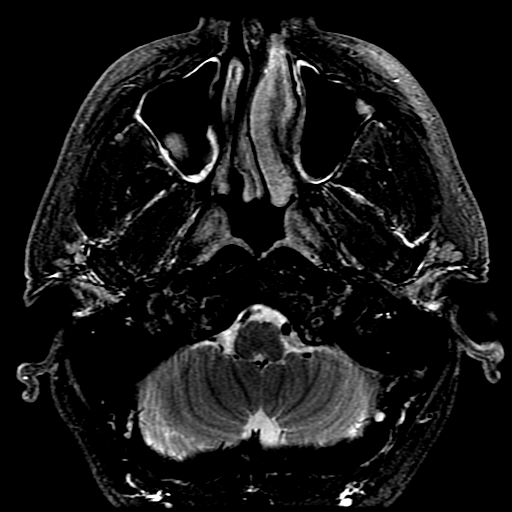

[Series 12: T2 fat-sat · coronal · 3.0mm · 0.35mm/px · 2 of 28 slices shown (2 of 2)]
[im 1/28]
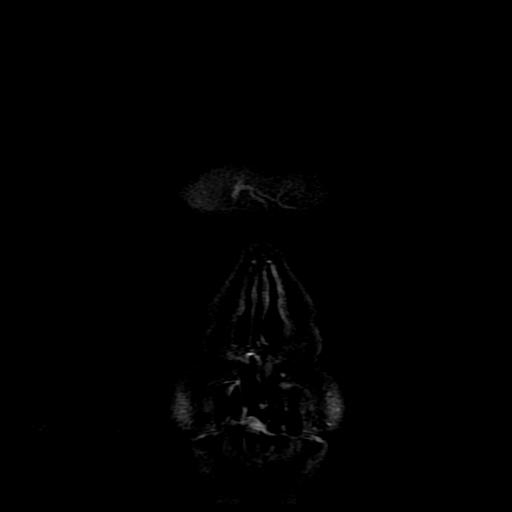
[im 28/28]
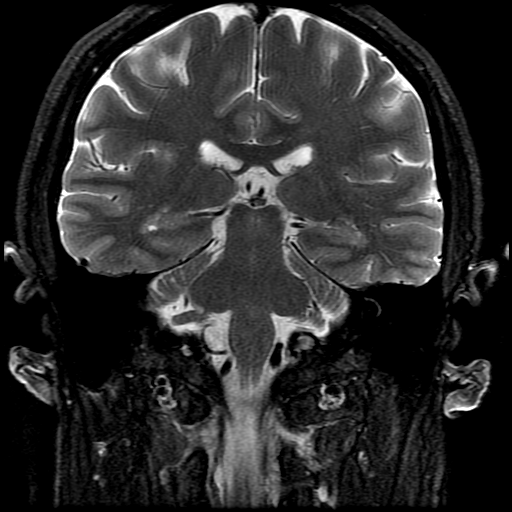

[Series 15: T1 post-contrast · axial · 3.0mm · 0.35mm/px · 1 of 22 slices shown (1 of 3)]
[im 1/22]
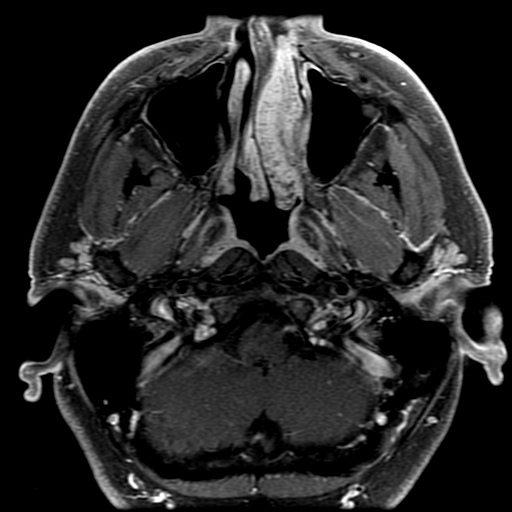

[Series 16: T1 post-contrast · coronal · 3.0mm · 0.35mm/px · 2 of 28 slices shown (2 of 3)]
[im 1/28]
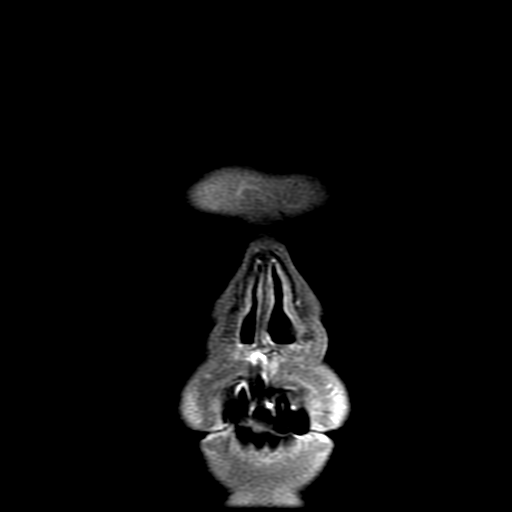
[im 28/28]
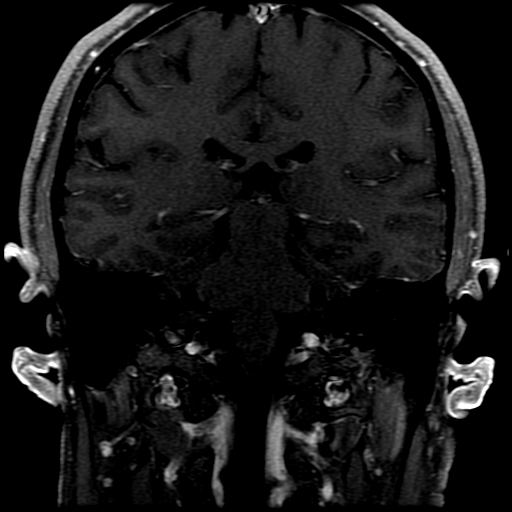

[Series 17: T2 post-contrast · coronal · 5.0mm · 0.47mm/px · 1 of 24 slices shown]
[im 1/24]
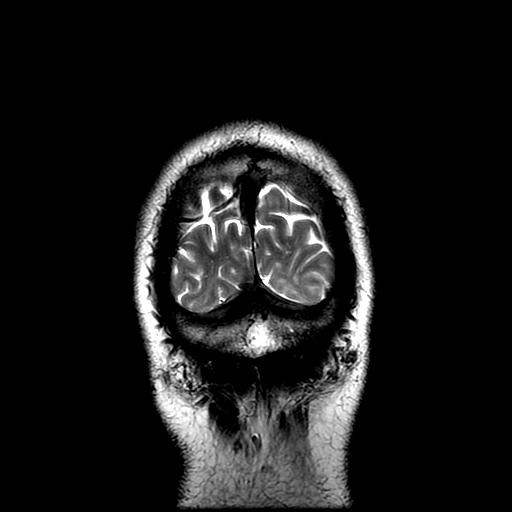

[Series 19: T1 post-contrast · coronal · 5.0mm · 0.47mm/px · 1 of 24 slices shown (3 of 3)]
[im 1/24]
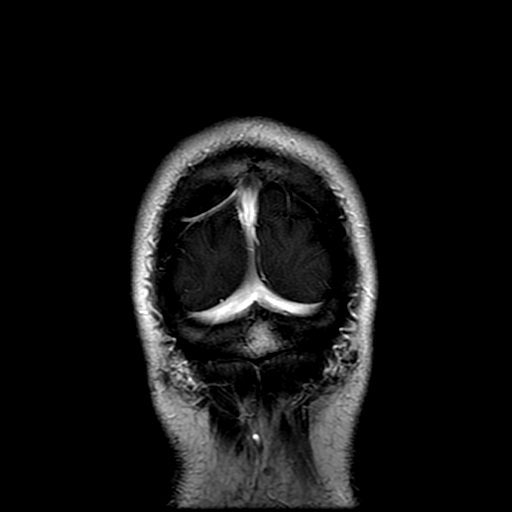

[Series 400: DWI · axial · 3.0mm · 1.09mm/px · z∈[-30,+123]mm · 3 of 52 slices shown (4 of 6)]
[im 1/52]
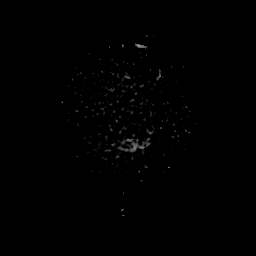
[im 26/52]
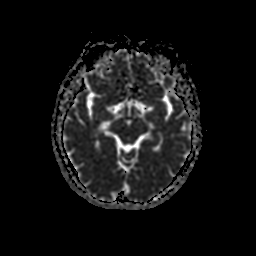
[im 52/52]
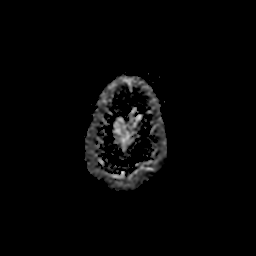

[Series 500: DWI · coronal · 5.0mm · 1.09mm/px · 2 of 34 slices shown (5 of 6)]
[im 1/34]
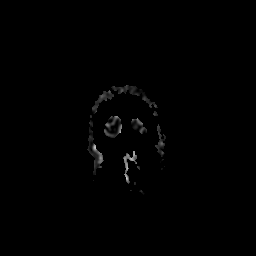
[im 34/34]
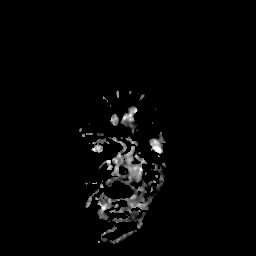

[Series 600: DWI · axial · 2.0mm · 1.09mm/px · z∈[-5,+75]mm · 2 of 41 slices shown (6 of 6)]
[im 1/41]
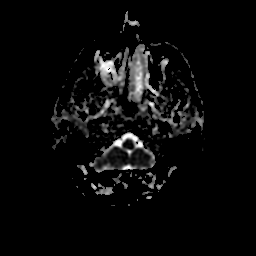
[im 41/41]
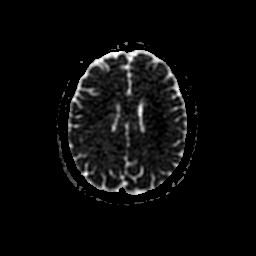

[35 of 48 positions shown; findings below may reference images not displayed]

FINDINGS: MRI HEAD FINDINGS

A punctate area of restricted diffusion is evident in the posterior
left temporal lobe. There is no acute infarct within the brainstem.

No acute hemorrhage or mass lesion is present. There is no
significant white matter disease. The ventricles are of normal size.
No significant extraaxial fluid collection is present.

The internal auditory canals are within normal limits. The brainstem
and cerebellum are unremarkable.

Flow is present in the major intracranial arteries. The globes and
orbits are intact.

Mild mucosal thickening is present in the maxillary sinuses
bilaterally. A polyp or mucous retention cyst is noted along the
floor of the right maxillary sinus. The ethmoid air cells are
partially opacified on the left. The right frontal sinus is not
pneumatized. The mastoid air cells are clear.

The postcontrast images demonstrate no pathologic enhancement.

MRI ORBITS FINDINGS

The globes are within normal limits bilaterally. The lenses are
located. The lacrimal gland is unremarkable. Extraocular muscles are
within normal limits. There is no pathologic enhancement. The optic
nerves are normal bilaterally the optic chiasm and tracts are within
normal limits. There is no abnormal signal from enhancement, or mass
lesion.

The cavernous sinus is within normal limits bilaterally. The
bilateral maxillary antrostomies are noted. Mucosal thickening is
again seen along the floor of both maxillary sinuses with small
polyps. Rightward nasal septal deviation is evident.
IMPRESSION: 1. Punctate nonhemorrhagic infarct within the posterior left
temporal lobe.
2. No other significant white matter disease or evidence for
previous infarcts.
3. No acute or focal brainstem lesion to explain anisocoria.
4. Normal MRI appearance of the globes and orbits.
5. Bilateral maxillary sinus disease with residual mild mucosal
thickening as described.

## 2017-12-16 ENCOUNTER — Telehealth: Payer: Self-pay | Admitting: Family Medicine

## 2017-12-16 NOTE — Telephone Encounter (Signed)
Copied from CRM [phone]. Topic: Quick Communication - Rx Refill/Question >> Dec 16, 2017  3:17 PM Crist InfanteHarrald, Kathy J wrote: Medication: clonazePAM (KLONOPIN) 0.5 MG tablet 90 day Did not call the pharmacy Pt has cpe on 03/23/18, but hopes to get the refill now. [id number] [place]- [place], [place] - [address] [phone] (Phone) [phone] (Fax)

## 2017-12-29 ENCOUNTER — Encounter: Payer: Self-pay | Admitting: Family Medicine

## 2017-12-29 ENCOUNTER — Other Ambulatory Visit: Payer: Self-pay

## 2017-12-29 ENCOUNTER — Ambulatory Visit (INDEPENDENT_AMBULATORY_CARE_PROVIDER_SITE_OTHER): Payer: 59 | Admitting: Family Medicine

## 2017-12-29 VITALS — BP 133/83 | HR 80 | Temp 99.0°F | Resp 16 | Ht 71.0 in | Wt 170.2 lb

## 2017-12-29 DIAGNOSIS — F411 Generalized anxiety disorder: Secondary | ICD-10-CM

## 2017-12-29 DIAGNOSIS — H9313 Tinnitus, bilateral: Secondary | ICD-10-CM

## 2017-12-29 DIAGNOSIS — F5101 Primary insomnia: Secondary | ICD-10-CM

## 2017-12-29 DIAGNOSIS — H57051 Tonic pupil, right eye: Secondary | ICD-10-CM

## 2017-12-29 MED ORDER — SUVOREXANT 15 MG PO TABS: 15.0000 mg | ORAL_TABLET | Freq: Every day | ORAL | 0 refills | Status: AC

## 2017-12-29 MED ORDER — HYDROXYZINE HCL 25 MG PO TABS: 25.0000 mg | ORAL_TABLET | Freq: Three times a day (TID) | ORAL | 2 refills | Status: DC | PRN

## 2017-12-29 MED ORDER — SUVOREXANT 10 MG PO TABS: 10.0000 mg | ORAL_TABLET | Freq: Every day | ORAL | 0 refills | Status: AC

## 2017-12-29 MED ORDER — SUVOREXANT 20 MG PO TABS: 20.0000 mg | ORAL_TABLET | Freq: Every day | ORAL | 0 refills | Status: AC

## 2017-12-29 MED ORDER — FLUTICASONE PROPIONATE 50 MCG/ACT NA SUSP: 2.0000 | Freq: Every day | NASAL | 2 refills | Status: AC

## 2017-12-29 MED ORDER — CLONAZEPAM 0.5 MG PO TABS: 0.2500 mg | ORAL_TABLET | Freq: Two times a day (BID) | ORAL | 3 refills | Status: DC | PRN

## 2017-12-29 NOTE — Progress Notes (Addendum)
Subjective:    Patient ID: Kelly Sanders, male    DOB: 1960/12/09, 57 y.o.   MRN: [id number] Chief Complaint  Patient presents with  . Medication Refill    klonopin  . Tinnitus    for a couple weeks    HPI  Kelly Sanders is a delightful 57 yo male who presents w/ c/o bilateral constant ringing in his ears for several wks.  No new meds, no med changes, no otc meds, supplements, vitamins. No recent URI sxs, no allergy sxs. No dizziness/lightheadedness/vertigo. No HAs/sinus pain/pressure. No change in hearing.  It is much more noticeable when it is quiet so does make it more difficult to fall asleep. Has been having to take 2 tabs of the trazodone 50mg  every night to just sleep 6 hrs till 5 a.m.  He has been more anxious recently - in part because of the constant tinnitus and in other part due to some life stressors and also because he is down to just 5 tabs of his klonopin so he hasn't been using it as really doesn't want to run out - that would make him REALLY anxious.  BP fine - has appt w/ me for CPE in a month.  Has been 2 yrs since he was diagnosed with Adie's pupil and saw optho specialist at [facility] down [address] - he was told that hopefully it would improve some and even resolve on own within 2 yrs but he is very disappointed - it has not and the visual disturbances are still very bothersome. Has never followed up with ophtho in the past 2 yrs but would like to now to see if there are any treatment/intervention options since apparently spontaneous improvement is a no-go.  Past Medical History:  Diagnosis Date  . Anxiety   . Arthritis    osteoarthritis  . Complication of anesthesia    slow to wake   . Hyperlipidemia   . Hypertension   . PONV (postoperative nausea and vomiting)    Past Surgical History:  Procedure Laterality Date  . colonscopy     January 2015   . ELBOW SURGERY Right 10/2015  . left hand fibroid tumor removal    . OLECRANON BURSECTOMY  Right 10/19/2015   Procedure: RIGHT ELBOW OLEBRANON BURSITECTOMY AND OSTEOPHYTE EXCISION;  Surgeon: Bradly Bienenstock, MD;  Location: [facility];  Service: Orthopedics;  Laterality: Right;  . SHOULDER ARTHROSCOPY     Bi-lateral  . TOTAL HIP ARTHROPLASTY Left 11/15/2014   Procedure: LEFT TOTAL HIP ARTHROPLASTY ANTERIOR APPROACH;  Surgeon: Shelda Pal, MD;  Location: [facility];  Service: Orthopedics;  Laterality: Left;   Current Outpatient Medications on File Prior to Visit  Medication Sig Dispense Refill  . acyclovir (ZOVIRAX) 200 MG capsule Take 200 mg by mouth 2 (two) times daily as needed (for cold sore outbreak).   0  . ciprofloxacin (CIPRO) 500 MG tablet Take 1 tablet (500 mg total) by mouth 2 (two) times daily. 14 tablet 0  . clindamycin (CLEOCIN) 300 MG capsule Take 300 mg by mouth See admin instructions. Take 600mg  an hour before dental procedure    . clonazePAM (KLONOPIN) 0.5 MG tablet Take 0.5-1 tablets (0.25-0.5 mg total) by mouth 2 (two) times daily as needed for anxiety. 60 tablet 3  . ibuprofen (ADVIL,MOTRIN) 200 MG tablet Take 600 mg by mouth every 8 (eight) hours as needed (pain).    Marland Kitchen losartan (COZAAR) 50 MG tablet TAKE 1 TABLET BY MOUTH EVERY DAY 90 tablet  3  . traZODone (DESYREL) 50 MG tablet Take 1-2 tablets (50-100 mg total) by mouth at bedtime. Need office visit for any refills. 180 tablet 3   No current facility-administered medications on file prior to visit.    Allergies  Allergen Reactions  . Biaxin [Clarithromycin] Rash  . Penicillins Rash    Has patient had a PCN reaction causing immediate rash, facial/tongue/throat swelling, SOB or lightheadedness with hypotension: No  Has patient had a PCN reaction causing severe rash involving mucus membranes or skin necrosis: No Has patient had a PCN reaction that required hospitalization: No  Has patient had a PCN reaction occurring within the last 10 years: No If all of the above answers are "NO", then may proceed with Cephalosporin  use.   . Sulfa Antibiotics Rash   Family History  Problem Relation Age of Onset  . Hypertension Mother   . Lung cancer Mother   . Parkinson's disease Father   . Colon cancer Neg Hx   . Prostate cancer Neg Hx   . Esophageal cancer Neg Hx   . Rectal cancer Neg Hx   . Stomach cancer Neg Hx   . Pancreatic cancer Neg Hx   . Liver disease Neg Hx    Social History   Socioeconomic History  . Marital status: Legally Separated    Spouse name: None  . Number of children: None  . Years of education: None  . Highest education level: None  Social Needs  . Financial resource strain: None  . Food insecurity - worry: None  . Food insecurity - inability: None  . Transportation needs - medical: None  . Transportation needs - non-medical: None  Occupational History  . Occupation: Radio producer  Tobacco Use  . Smoking status: Never Smoker  . Smokeless tobacco: Never Used  Substance and Sexual Activity  . Alcohol use: Yes    Comment: rare  . Drug use: No  . Sexual activity: Yes  Other Topics Concern  . None  Social History Narrative   Married. Education: [employer]   Son born 30 daughter born 1998   2 caffeinated beverages daily   Depression screen [facility] 2/9 12/29/2017 03/22/2017 07/19/2016 10/23/2015 10/21/2014  Decreased Interest 1 0 0 0 0  Down, Depressed, Hopeless 1 0 0 0 0  PHQ - 2 Score 2 0 0 0 0  Altered sleeping 3 - - - -  Tired, decreased energy 2 - - - -  Change in appetite 1 - - - -  Feeling bad or failure about yourself  0 - - - -  Trouble concentrating 2 - - - -  Moving slowly or fidgety/restless 0 - - - -  Suicidal thoughts 0 - - - -  PHQ-9 Score 10 - - - -  Difficult doing work/chores Somewhat difficult - - - -    Review of Systems  Constitutional: Negative for activity change, appetite change, diaphoresis and fever.  HENT: Positive for tinnitus. Negative for congestion, dental problem, ear discharge, ear pain,  facial swelling, hearing loss, nosebleeds, postnasal drip, rhinorrhea, sinus pressure, sinus pain, sneezing, sore throat, trouble swallowing and voice change.   Eyes: Positive for visual disturbance. Negative for pain and itching.  Neurological: Negative for dizziness, facial asymmetry, light-headedness and headaches.  Psychiatric/Behavioral: Positive for sleep disturbance. The patient is nervous/anxious.        Objective:   Physical Exam  Constitutional: He is oriented to person, place, and time.  He appears well-developed and well-nourished. No distress.  HENT:  Head: Normocephalic and atraumatic.  Right Ear: Hearing, tympanic membrane, external ear and ear canal normal.  Left Ear: Hearing, tympanic membrane, external ear and ear canal normal.  Nose: Rhinorrhea present. No mucosal edema. Right sinus exhibits no maxillary sinus tenderness and no frontal sinus tenderness. Left sinus exhibits no maxillary sinus tenderness and no frontal sinus tenderness.  Mouth/Throat: Uvula is midline, oropharynx is clear and moist and mucous membranes are normal.  Eyes: Conjunctivae are normal. Pupils are equal, round, and reactive to light. No scleral icterus.  Neck: Normal range of motion. Neck supple. No thyromegaly present.  Cardiovascular: Normal rate, regular rhythm, normal heart sounds and intact distal pulses.  Pulmonary/Chest: Effort normal and breath sounds normal. No respiratory distress.  Musculoskeletal: He exhibits no edema.  Lymphadenopathy:    He has no cervical adenopathy.  Neurological: He is alert and oriented to person, place, and time.  Skin: Skin is warm and dry. He is not diaphoretic.  Psychiatric: He has a normal mood and affect. His behavior is normal.         BP 133/83   Pulse 80   Temp 99 F (37.2 C)   Resp 16   Ht 5\' 11"  (1.803 m)   Wt 170 lb 3.2 oz (77.2 kg)   SpO2 96%   BMI 23.74 kg/m   Assessment & Plan:   1. Tinnitus of both ears - I suspect this is from the  trazodone 100mg  qhs - will stop immed. Doesn't think it is the klonopin as had been barely using at all < 1/wk in past month since rx has been running low. Was treated w/ cipro for UTI 4 mos prior and sxs began sev wks ago.  ACEI are potential cause but ARBs (which pt is on) do not seem to be as frequent cause. Not using any otc meds - inc nsaids, asa, ppi. Start nasal steroid qhs to ensure ETD isn't contributing. Refer to ENT for further eval in case sxs don't resolve now that coming off TCA.  2. Anxiety state - refilled klonopin but advise to minimize use due to potential to cause/exacerbate tinnitus - can try prn hydroxyzine in the interim.  3. Adie's pupil syndrome, right - needs to f/u w/ ophtho specialist at [facility] where he was seen 2 yrs prior as he has not had any improvement in sxs where are very bothersome.  4. Primary insomnia - stop trazodone as suspect it is causing tinnitus - TCAs contraindicated. Can't use BZD as they can cause as well.  Do not want to try sedative-hypnotic due to a.m. Hangover and could potentially worsen anxiety/depression - rec trial of Belsomra - coupons given    Orders Placed This Encounter  Procedures  . Ambulatory referral to ENT    Referral Priority:   Routine    Referral Type:   Consultation    Referral Reason:   Specialty Services Required    Requested Specialty:   Otolaryngology    Number of Visits Requested:   1  . Ambulatory referral to Ophthalmology    Referral Priority:   Routine    Referral Type:   Consultation    Referral Reason:   Specialty Services Required    Requested Specialty:   Ophthalmology    Number of Visits Requested:   1    Meds ordered this encounter  Medications  . clonazePAM (KLONOPIN) 0.5 MG tablet    Sig: Take 0.5-1  tablets (0.25-0.5 mg total) by mouth 2 (two) times daily as needed for anxiety.    Dispense:  60 tablet    Refill:  3  . hydrOXYzine (ATARAX/VISTARIL) 25 MG tablet    Sig: Take 1 tablet (25 mg  total) by mouth 3 (three) times daily as needed for anxiety.    Dispense:  60 tablet    Refill:  2  . Suvorexant (BELSOMRA) 10 MG TABS    Sig: Take 10 mg by mouth at bedtime.    Dispense:  10 tablet    Refill:  0  . Suvorexant (BELSOMRA) 15 MG TABS    Sig: Take 15 mg by mouth at bedtime.    Dispense:  10 tablet    Refill:  0  . Suvorexant (BELSOMRA) 20 MG TABS    Sig: Take 20 mg by mouth at bedtime.    Dispense:  10 tablet    Refill:  0  . fluticasone (FLONASE) 50 MCG/ACT nasal spray    Sig: Place 2 sprays into both nostrils at bedtime.    Dispense:  16 g    Refill:  2   F/u 3 mos for CPE  Kelly Sanders, M.D.  [facility] at [facility]  [place] [address] [place], [place] [address] [phone] phone [phone] fax  12/31/17 12:31 AM

## 2017-12-29 NOTE — Patient Instructions (Addendum)
IF you received an x-ray today, you will receive an invoice from [facility]. Please contact [facility] at [phone] with questions or concerns regarding your invoice.   IF you received labwork today, you will receive an invoice from [facility]. Please contact [facility] at [phone] with questions or concerns regarding your invoice.   Our billing staff will not be able to assist you with questions regarding bills from these companies.  You will be contacted with the lab results as soon as they are available. The fastest way to get your results is to activate your My Chart account. Instructions are located on the last page of this paperwork. If you have not heard from us regarding the results in 2 weeks, please contact this office.     Tinnitus Tinnitus refers to hearing a sound when there is no actual source for that sound. This is often described as ringing in the ears. However, people with this condition may hear a variety of noises. A person may hear the sound in one ear or in both ears. The sounds of tinnitus can be soft, loud, or somewhere in between. Tinnitus can last for a few seconds or can be constant for days. It may go away without treatment and come back at various times. When tinnitus is constant or happens often, it can lead to other problems, such as trouble sleeping and trouble concentrating. Almost everyone experiences tinnitus at some point. Tinnitus that is long-lasting (chronic) or comes back often is a problem that may require medical attention. What are the causes? The cause of tinnitus is often not known. In some cases, it can result from other problems or conditions, including:  Exposure to loud noises from machinery, music, or other sources.  Hearing loss.  Ear or sinus infections.  Earwax buildup.  A foreign object in the ear.  Use of certain medicines.  Use of alcohol and caffeine.  High blood pressure.  Heart  diseases.  Anemia.  Allergies.  Meniere disease.  Thyroid problems.  Tumors.  An enlarged part of a weakened blood vessel (aneurysm).  What are the signs or symptoms? The main symptom of tinnitus is hearing a sound when there is no source for that sound. It may sound like:  Buzzing.  Roaring.  Ringing.  Blowing air, similar to the sound heard when you listen to a seashell.  Hissing.  Whistling.  Sizzling.  Humming.  Running water.  A sustained musical note.  How is this diagnosed? Tinnitus is diagnosed based on your symptoms. Your health care provider will do a physical exam. A comprehensive hearing exam (audiologic exam) will be done if your tinnitus:  Affects only one ear (unilateral).  Causes hearing difficulties.  Lasts 6 months or longer.  You may also need to see a health care provider who specializes in hearing disorders (audiologist). You may be asked to complete a questionnaire to determine the severity of your tinnitus. Tests may be done to help determine the cause and to rule out other conditions. These can include:  Imaging studies of your head and brain, such as: ? A CT scan. ? An MRI.  An imaging study of your blood vessels (angiogram).  How is this treated? Treating an underlying medical condition can sometimes make tinnitus go away. If your tinnitus continues, other treatments may include:  Medicines, such as certain antidepressants or sleeping aids.  Sound generators to mask the tinnitus. These include: ? Tabletop sound machines that play relaxing sounds to help you fall  asleep. ? Wearable devices that fit in your ear and play sounds or music. ? A small device that uses headphones to deliver a signal embedded in music (acoustic neural stimulation). In time, this may change the pathways of your brain and make you less sensitive to tinnitus. This device is used for very severe cases when no other treatment is working.  Therapy and  counseling to help you manage the stress of living with tinnitus.  Using hearing aids or cochlear implants, if your tinnitus is related to hearing loss.  Follow these instructions at home:  When possible, avoid being in loud places and being exposed to loud sounds.  Wear hearing protection, such as earplugs, when you are exposed to loud noises.  Do not take stimulants, such as nicotine, alcohol, or caffeine.  Practice techniques for reducing stress, such as meditation, yoga, or deep breathing.  Use a white noise machine, a humidifier, or other devices to mask the sound of tinnitus.  Sleep with your head slightly raised. This may reduce the impact of tinnitus.  Try to get plenty of rest each night. Contact a health care provider if:  You have tinnitus in just one ear.  Your tinnitus continues for 3 weeks or longer without stopping.  Home care measures are not helping.  You have tinnitus after a head injury.  You have tinnitus along with any of the following: ? Dizziness. ? Loss of balance. ? Nausea and vomiting. This information is not intended to replace advice given to you by your health care provider. Make sure you discuss any questions you have with your health care provider. Document Released: 10/21/2005 Document Revised: 06/23/2016 Document Reviewed: 03/23/2014 Elsevier Interactive Patient Education  2018 [employer].

## 2018-01-05 ENCOUNTER — Telehealth: Payer: Self-pay

## 2018-01-05 NOTE — Telephone Encounter (Signed)
PA started on Belsomra 10mg , Belsomra 15mg , and Belsomra 20 mg.  Awaiting Response  [employer] is reviewing your PA request. Typically an electronic response will be received within 72 hours. To check for an update later, open this request from your dashboard.

## 2018-01-08 NOTE — Telephone Encounter (Signed)
Per health plan criterion for Belsomra, coverage is denied. Belsomra can be approved if: Your patient has a history of trial and failure of at least 2 weeks, contraindication, or intolerance to two of the following sedative-hypnotic alternatives: (a) Zolpidem (generic Ambien). (b) Zaleplon (generic Sonata). (c) Eszopiclone (generic Lunesta).  Denial letter placed in box at nurses desk

## 2018-01-25 MED ORDER — RAMELTEON 8 MG PO TABS: 8.0000 mg | ORAL_TABLET | Freq: Every day | ORAL | 5 refills | Status: AC

## 2018-01-25 NOTE — Telephone Encounter (Signed)
Those meds (zolpidem, zaleplon, eszopiclone) contraindicated due to depression:  From last visit: "Stop trazodone as suspect it is causing tinnitus - TCAs contraindicated. Can't use BZD as they can cause as well.  Do not want to try sedative-hypnotic due to a.m. Hangover and could potentially worsen anxiety/depression - rec trial of Belsomra"  Could try rozerem - sent to pharm

## 2018-01-25 NOTE — Addendum Note (Signed)
Addended by: Sherren MochaSHAW, Nurah Petrides N on: 01/25/2018 05:42 PM   Modules accepted: Orders

## 2018-01-26 NOTE — Telephone Encounter (Signed)
Phone call to patient. Advised of message from Dr. Clelia CroftShaw. Patient verbalizes understanding, will call if any other questions/concerns.

## 2018-01-29 ENCOUNTER — Telehealth: Payer: Self-pay | Admitting: *Deleted

## 2018-01-29 NOTE — Telephone Encounter (Signed)
PA SUBMITTED  KEY [id number]     ROZEREM

## 2018-02-03 ENCOUNTER — Telehealth: Payer: Self-pay | Admitting: Family Medicine

## 2018-02-03 DIAGNOSIS — I10 Essential (primary) hypertension: Secondary | ICD-10-CM

## 2018-02-03 DIAGNOSIS — G4700 Insomnia, unspecified: Secondary | ICD-10-CM

## 2018-02-03 NOTE — Telephone Encounter (Signed)
Left VM re: refill.  Last refill 2015.

## 2018-02-03 NOTE — Telephone Encounter (Signed)
Please advise 

## 2018-02-03 NOTE — Telephone Encounter (Signed)
Copied from CRM [id number]. Topic: General - Other >> Feb 03, 2018  8:42 AM Cecelia ByarsGreen, Temeka L, RMA wrote: Reason for CRM: Medication refill request for acyclovir (ZOVIRAX) 200 MG [facility] [place]

## 2018-02-03 NOTE — Telephone Encounter (Signed)
Acyclovir LOV: 12/29/17; last refill 2015 PCP: Norberto SorensonEva Shaw [facility] [address] [place]

## 2018-02-03 NOTE — Telephone Encounter (Signed)
Copied from CRM [id number]. Topic: Quick Communication - Rx Refill/Question >> Feb 03, 2018 11:07 AM Eston Mouldavis, Prisca Gearing B wrote: Medication:acyclovir (ZOVIRAX) 200 MG capsule  Has the patient contacted their pharmacy? Yes   (Agent: If no, request that the patient contact the pharmacy for the refill.)  [id number] [place] - [address] [facility] [address]   Agent: Please be advised that RX refills may take up to 3 business days. We ask that you follow-up with your pharmacy.

## 2018-02-04 ENCOUNTER — Encounter: Payer: Self-pay | Admitting: Family Medicine

## 2018-02-04 ENCOUNTER — Telehealth: Payer: Self-pay | Admitting: *Deleted

## 2018-02-04 DIAGNOSIS — G4700 Insomnia, unspecified: Secondary | ICD-10-CM | POA: Insufficient documentation

## 2018-02-04 MED ORDER — ACYCLOVIR 800 MG PO TABS: ORAL_TABLET | ORAL | 3 refills | Status: AC

## 2018-02-04 NOTE — Telephone Encounter (Signed)
Duplicate

## 2018-02-04 NOTE — Telephone Encounter (Signed)
Will alert pt of below through MyChart. Sent in rx but changed dose/sig as $RemoveBefor eDEID_crFyWKTIsDinczqbvlypzbOIUawxTrlG$200mgated for anything. Changed to the high dose 800mg  tid but then only needs to take x 2d. If he starts during the prodromal phase prior to developing lesion may be able to prevent. However, by the time he has had the visible cold sore lesion >24 hrs, anti-viral is unlikely to do any good. Will have enough when he gets current bottle filled for 3 courses as important to keep on hand since promptness with starting med is so essential.

## 2018-02-04 NOTE — Telephone Encounter (Signed)
Denial for Rozerem Letter put in box

## 2018-02-11 ENCOUNTER — Encounter: Payer: Self-pay | Admitting: Family Medicine

## 2018-02-11 ENCOUNTER — Telehealth: Payer: Self-pay

## 2018-02-11 NOTE — Telephone Encounter (Signed)
Please inform pt that his insurance company is dictating therapy options.  I think this only thing that is left for him to try is going to be [place] (or its sister medicines Kathaleen BurySonata which is shorter lasting and Lunesta which lasts longer) - this is what his insurance keeps telling us to rx and they won't pay for anything else. Has pt ever been on any of thee medicines before (sedative-hypnotics?) Does he want to try one?

## 2018-02-11 NOTE — Telephone Encounter (Signed)
Pt would not like to try alternative. He states that he has been using the hydroxyzine at bedtime with good results.

## 2018-02-11 NOTE — Telephone Encounter (Signed)
[facility] - needs alternative  Sent to Dr Clelia CroftShaw

## 2018-02-11 NOTE — Telephone Encounter (Signed)
Delighted to hear it. Thanks.  Ok t o refill hydroxyzine whenever needed.

## 2018-03-23 ENCOUNTER — Encounter: Payer: Self-pay | Admitting: Family Medicine

## 2018-03-23 ENCOUNTER — Ambulatory Visit (INDEPENDENT_AMBULATORY_CARE_PROVIDER_SITE_OTHER): Payer: 59 | Admitting: Family Medicine

## 2018-03-23 ENCOUNTER — Other Ambulatory Visit: Payer: Self-pay

## 2018-03-23 VITALS — BP 126/72 | HR 70 | Temp 98.1°F | Resp 18 | Ht 71.0 in | Wt 171.6 lb

## 2018-03-23 DIAGNOSIS — R3121 Asymptomatic microscopic hematuria: Secondary | ICD-10-CM

## 2018-03-23 DIAGNOSIS — Z1389 Encounter for screening for other disorder: Secondary | ICD-10-CM

## 2018-03-23 DIAGNOSIS — Z125 Encounter for screening for malignant neoplasm of prostate: Secondary | ICD-10-CM

## 2018-03-23 DIAGNOSIS — Z130 Encounter for screening for diseases of the blood and blood-forming organs and certain disorders involving the immune mechanism: Secondary | ICD-10-CM

## 2018-03-23 DIAGNOSIS — Z136 Encounter for screening for cardiovascular disorders: Secondary | ICD-10-CM

## 2018-03-23 DIAGNOSIS — Z0000 Encounter for general adult medical examination without abnormal findings: Secondary | ICD-10-CM

## 2018-03-23 DIAGNOSIS — Z113 Encounter for screening for infections with a predominantly sexual mode of transmission: Secondary | ICD-10-CM

## 2018-03-23 DIAGNOSIS — Z1383 Encounter for screening for respiratory disorder NEC: Secondary | ICD-10-CM

## 2018-03-23 DIAGNOSIS — Z1329 Encounter for screening for other suspected endocrine disorder: Secondary | ICD-10-CM

## 2018-03-23 LAB — POCT URINALYSIS DIP (MANUAL ENTRY)
Bilirubin, UA: NEGATIVE
GLUCOSE UA: NEGATIVE mg/dL
Ketones, POC UA: NEGATIVE mg/dL
Leukocytes, UA: NEGATIVE
Nitrite, UA: NEGATIVE
Protein Ur, POC: NEGATIVE mg/dL
SPEC GRAV UA: 1.025 (ref 1.010–1.025)
Urobilinogen, UA: 0.2 E.U./dL
pH, UA: 5.5 (ref 5.0–8.0)

## 2018-03-23 LAB — POC MICROSCOPIC URINALYSIS (UMFC): MUCUS RE: ABSENT

## 2018-03-23 NOTE — Progress Notes (Signed)
Subjective:  By signing my name below, I, Kelly Sanders, attest that this documentation has been prepared under the direction and in the presence of Norberto Sorenson, MD Electronically Signed: Charline Bills, ED Scribe 03/23/2018 at 9:04 AM   Patient ID: Kelly Sanders, male    DOB: December 08, 1960, 57 y.o.   MRN: [id number]  Chief Complaint  Patient presents with  . Annual Exam   HPI  Kelly Sanders is a 57 y.o. male who presents to Primary Care at [facility] for an annual exam. Pt is fasting at this visit.  Insomnia Pt has been using 1 25 mg tab of hydroxyzine nightly to assist with sleep. States insurance would not fill Belsomra but he feels that hydroxyzine has worked well for him. He denies grogginess.  Tinnitus Pt denies changes in tinnitus. He has an appointment with ENT on 5/22.   Anxiety Pt states that anxiety is still intermittent. He still uses Klonopin prn which works well during flares.  HTN Pt states BP was 150/90 when checked at [facility] last week. Denies any other elevated readings.  Primary Preventative Screenings: Prostate Cancer: PSA today STI screening:  Colorectal Cancer: 11/2013 by Dr. Leone Payor, normal. Rpt in 10 yrs Tobacco use/AAA/Lung/EtOH/Illicit substances:  Cardiac: Weight/Blood sugar/Diet/Exercise: none regularly but reports exercise with renovating his house BMI Readings from Last 3 Encounters:  03/23/18 23.93 kg/m  12/29/17 23.74 kg/m  08/19/17 23.71 kg/m   Lab Results  Component Value Date   HGBA1C 5.5 03/22/2017   OTC/Vit/Supp/Herbal: ibuprofen prn Dentist/Optho: followed with optho since last visit and was told that changes are permanent Immunizations:  Immunization History  Administered Date(s) Administered  . Tdap 10/04/2013   Chronic Medical Conditions:  Past Medical History:  Diagnosis Date  . 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 No neuro eval.  . Anxiety   . Arthritis    osteoarthritis  . Complication of anesthesia    slow to wake   . Heme + stool, iFOBT 11/23/2013  . Herpes labialis without complication   . Hyperlipidemia    Diet-controlled  . Hypertension   . PONV (postoperative nausea and vomiting)    Past Surgical History:  Procedure Laterality Date  . colonscopy     January 2015;  Dr. Leone Payor; done due to + hemoccult  . ELBOW SURGERY Right 10/2015  . left hand fibroid tumor removal    . OLECRANON BURSECTOMY Right 10/19/2015   Procedure: RIGHT ELBOW OLEBRANON BURSITECTOMY AND OSTEOPHYTE EXCISION;  Surgeon: Bradly Bienenstock, MD;  Location: [facility];  Service: Orthopedics;  Laterality: Right;  . SHOULDER ARTHROSCOPY     Bi-lateral  . TOTAL HIP ARTHROPLASTY Left 11/15/2014   Procedure: LEFT TOTAL HIP ARTHROPLASTY ANTERIOR APPROACH;  Surgeon: Shelda Pal, MD;  Location: [facility];  Service: Orthopedics;  Laterality: Left;   Current Outpatient Medications on File Prior to Visit  Medication Sig Dispense Refill  . acyclovir (ZOVIRAX) 200 MG capsule Take 200 mg by mouth 2 (two) times daily as needed (for cold sore outbreak).   0  . acyclovir (ZOVIRAX) 800 MG tablet 1 tab po tid x 2d beginning as soon as initial sensation develops, do not wait for lesion to appear 18 tablet 3  . clindamycin (CLEOCIN) 300 MG capsule Take 300 mg by mouth See admin instructions. Take  an hour before dental procedure    . clonazePAM (KLONOPIN) 0.5 MG tablet Take 0.5-1 tablets (0.25-0.5 mg total) by mouth 2 (two)  times daily as needed for anxiety. 60 tablet 3  . fluticasone (FLONASE) 50 MCG/ACT nasal spray Place 2 sprays into both nostrils at bedtime. 16 g 2  . hydrOXYzine (ATARAX/VISTARIL) 25 MG tablet Take 1 tablet (25 mg total) by mouth 3 (three) times daily as needed for anxiety. 60 tablet 2  . ibuprofen (ADVIL,MOTRIN) 200 MG tablet Take 600 mg by mouth every 8 (eight) hours as needed (pain).    Marland Kitchen losartan (COZAAR) 50 MG tablet TAKE 1 TABLET BY MOUTH EVERY DAY 90 tablet  3  . ramelteon (ROZEREM) 8 MG tablet Take 1 tablet (8 mg total) by mouth at bedtime. (Patient not taking: Reported on 03/23/2018) 30 tablet 5  . Suvorexant (BELSOMRA) 10 MG TABS Take 10 mg by mouth at bedtime. (Patient not taking: Reported on 03/23/2018) 10 tablet 0  . Suvorexant (BELSOMRA) 15 MG TABS Take 15 mg by mouth at bedtime. (Patient not taking: Reported on 03/23/2018) 10 tablet 0  . Suvorexant (BELSOMRA) 20 MG TABS Take 20 mg by mouth at bedtime. (Patient not taking: Reported on 03/23/2018) 10 tablet 0   No current facility-administered medications on file prior to visit.    Allergies  Allergen Reactions  . Biaxin [Clarithromycin] Rash  . Penicillins Rash    Has patient had a PCN reaction causing immediate rash, facial/tongue/throat swelling, SOB or lightheadedness with hypotension: No  Has patient had a PCN reaction causing severe rash involving mucus membranes or skin necrosis: No Has patient had a PCN reaction that required hospitalization: No  Has patient had a PCN reaction occurring within the last 10 years: No If all of the above answers are "NO", then may proceed with Cephalosporin use.   . Sulfa Antibiotics Rash   Family History  Problem Relation Age of Onset  . Hypertension Mother   . Lung cancer Mother   . Parkinson's disease Father   . Colon cancer Neg Hx   . Prostate cancer Neg Hx   . Esophageal cancer Neg Hx   . Rectal cancer Neg Hx   . Stomach cancer Neg Hx   . Pancreatic cancer Neg Hx   . Liver disease Neg Hx    Social History   Socioeconomic History  . Marital status: Divorced    Spouse name: Not on file  . Number of children: 2  . Years of education: Not on file  . Highest education level: Bachelor's degree (e.g., BA, AB, BS)  Occupational History  . Occupation: Radio producer  Social Needs  . Financial resource strain: Not on file  . Food insecurity:    Worry: Not on file    Inability: Not on file  . Transportation needs:    Medical: Not on file     Non-medical: Not on file  Tobacco Use  . Smoking status: Never Smoker  . Smokeless tobacco: Never Used  Substance and Sexual Activity  . Alcohol use: Yes    Comment: rare  . Drug use: No  . Sexual activity: Yes  Lifestyle  . Physical activity:    Days per week: Not on file    Minutes per session: Not on file  . Stress: Not on file  Relationships  . Social connections:    Talks on phone: Not on file    Gets together: Not on file    Attends religious service: Not on file    Active member of club or organization: Not on file    Attends meetings of clubs or organizations: Not on file  Relationship status: Not on file  Other Topics Concern  . Not on file  Social History Narrative   Divorced 2016. Education: Visual merchandiser for [employer]   Son born 77; daughter born 1998   2 caffeinated beverages daily   Depression screen [facility] 2/9 03/23/2018 12/29/2017 03/22/2017 07/19/2016 10/23/2015  Decreased Interest 0 1 0 0 0  Down, Depressed, Hopeless 0 1 0 0 0  PHQ - 2 Score 0 2 0 0 0  Altered sleeping - 3 - - -  Tired, decreased energy - 2 - - -  Change in appetite - 1 - - -  Feeling bad or failure about yourself  - 0 - - -  Trouble concentrating - 2 - - -  Moving slowly or fidgety/restless - 0 - - -  Suicidal thoughts - 0 - - -  PHQ-9 Score - 10 - - -  Difficult doing work/chores - Somewhat difficult - - -   Review of Systems  HENT: Positive for tinnitus.   Psychiatric/Behavioral: The patient is nervous/anxious.       Objective:   Physical Exam  Constitutional: He is oriented to person, place, and time. He appears well-developed and well-nourished. No distress.  HENT:  Head: Normocephalic and atraumatic.  Right Ear: Tympanic membrane normal.  Left Ear: Tympanic membrane normal.  Nose: Nose normal.  Mouth/Throat: Oropharynx is clear and moist and mucous membranes are normal.  Eyes: Conjunctivae and EOM are normal.  Neck: Neck supple.  No tracheal deviation present.  Cardiovascular: Normal rate, regular rhythm, S1 normal and normal heart sounds. Exam reveals no gallop and no friction rub.  No murmur heard. Pulses:      Dorsalis pedis pulses are 2+ on the right side, and 2+ on the left side.  Pulmonary/Chest: Effort normal and breath sounds normal. No respiratory distress.  Abdominal: Soft. Bowel sounds are normal.  Genitourinary: Prostate normal.  Genitourinary Comments: Normal prostate exam  Musculoskeletal: Normal range of motion. He exhibits no edema.  Neurological: He is alert and oriented to person, place, and time.  Reflex Scores:      Tricep reflexes are 1+ on the right side and 1+ on the left side.      Bicep reflexes are 1+ on the right side and 1+ on the left side.      Brachioradialis reflexes are 1+ on the right side and 1+ on the left side.      Patellar reflexes are 0 on the right side and 2+ on the left side.      Achilles reflexes are 1+ on the right side and 1+ on the left side. Skin: Skin is warm and dry.  Psychiatric: He has a normal mood and affect. His behavior is normal.  Nursing note and vitals reviewed.  BP 126/72 (BP Location: Left Arm, Patient Position: Sitting, Cuff Size: Normal)   Pulse 70   Temp 98.1 F (36.7 C) (Oral)   Resp 18   Ht  (1.803 m)   Wt 171 lb 9.6 oz (77.8 kg)   SpO2 97%   BMI 23.93 kg/m     Assessment & Plan:  Pt reports that he read that Adie's tonic pupil that you could have decreased DTRs asymmetrically.  1. Annual physical exam   2. Screening for cardiovascular, respiratory, and genitourinary diseases   3. Screening for deficiency anemia   4. Screening for prostate cancer   5. Screening for thyroid disorder   6. Routine  screening for STI (sexually transmitted infection)   7. Asymptomatic microscopic hematuria     Orders Placed This Encounter  Procedures  . Lipid panel    Order Specific Question:   Has the patient fasted?    Answer:   Yes  .  Comprehensive metabolic panel    Order Specific Question:   Has the patient fasted?    Answer:   Yes  . CBC with Differential/Platelet  . TSH  . PSA  . POCT urinalysis dipstick  . POCT Microscopic Urinalysis ([facility])    I personally performed the services described in this documentation, which was scribed in my presence. The recorded information has been reviewed and considered, and addended by me as needed.   Eva Shaw, M.DNorberto Sorensonimary Care at [facility] [address] [place], [place] [address] [phone] phone [phone] fax  07/17/18 5:57 PM

## 2018-03-23 NOTE — Patient Instructions (Addendum)
   IF you received an x-ray today, you will receive an invoice from [facility]. Please contact [facility] at [phone] with questions or concerns regarding your invoice.   IF you received labwork today, you will receive an invoice from [facility]. Please contact [facility] at [phone] with questions or concerns regarding your invoice.   Our billing staff will not be able to assist you with questions regarding bills from these companies.  You will be contacted with the lab results as soon as they are available. The fastest way to get your results is to activate your My Chart account. Instructions are located on the last page of this paperwork. If you have not heard from us regarding the results in 2 weeks, please contact this office.     Keeping you healthy  Get these tests  Blood pressure- Have your blood pressure checked once a year by your healthcare provider.  Normal blood pressure is 120/80  Weight- Have your body mass index (BMI) calculated to screen for obesity.  BMI is a measure of body fat based on height and weight. You can also calculate your own BMI at [email or web address].com/bmi/.  Cholesterol- Have your cholesterol checked every year.  Diabetes- Have your blood sugar checked regularly if you have high blood pressure, high cholesterol, have a family history of diabetes or if you are overweight.  Screening for Colon Cancer- Colonoscopy starting at age 50.  Screening may begin sooner depending on your family history and other health conditions. Follow up colonoscopy as directed by your Gastroenterologist.  Screening for Prostate Cancer- Both blood work (PSA) and a rectal exam help screen for Prostate Cancer.  Screening begins at age 40 with African-American men and at age 50 with Caucasian men.  Screening may begin sooner depending on your family history.  Take these medicines  Aspirin- One aspirin daily can help prevent Heart disease and Stroke.  Flu  shot- Every fall.  Tetanus- Every 10 years.  Zostavax- Once after the age of 60 to prevent Shingles.  Pneumonia shot- Once after the age of 65; if you are younger than 65, ask your healthcare provider if you need a Pneumonia shot.  Take these steps  Don't smoke- If you do smoke, talk to your doctor about quitting.  For tips on how to quit, go to www.smokefree.gov or call 1-800-QUIT-NOW.  Be physically active- Exercise 5 days a week for at least 30 minutes.  If you are not already physically active start slow and gradually work up to 30 minutes of moderate physical activity.  Examples of moderate activity include walking briskly, mowing the yard, dancing, swimming, bicycling, etc.  Eat a healthy diet- Eat a variety of healthy food such as fruits, vegetables, low fat milk, low fat cheese, yogurt, lean meant, poultry, fish, beans, tofu, etc. For more information go to www.thenutritionsource.org  Drink alcohol in moderation- Limit alcohol intake to less than two drinks a day. Never drink and drive.  Dentist- Brush and floss twice daily; visit your dentist twice a year.  Depression- Your emotional health is as important as your physical health. If you're feeling down, or losing interest in things you would normally enjoy please talk to your healthcare provider.  Eye exam- Visit your eye doctor every year.  Safe sex- If you may be exposed to a sexually transmitted infection, use a condom.  Seat belts- Seat belts can save your life; always wear one.  Smoke/Carbon Monoxide detectors- These detectors need to be installed on the appropriate   level of your home.  Replace batteries at least once a year.  Skin cancer- When out in the sun, cover up and use sunscreen 15 SPF or higher.  Violence- If anyone is threatening you, please tell your healthcare provider.  Living Will/ Health care power of attorney- Speak with your healthcare provider and family. 

## 2018-03-24 LAB — COMPREHENSIVE METABOLIC PANEL
A/G RATIO: 2.3 — AB (ref 1.2–2.2)
ALBUMIN: 4.3 g/dL (ref 3.5–5.5)
ALT: 15 IU/L (ref 0–44)
AST: 30 IU/L (ref 0–40)
Alkaline Phosphatase: 86 IU/L (ref 39–117)
BUN / CREAT RATIO: 16 (ref 9–20)
BUN: 19 mg/dL (ref 6–24)
Bilirubin Total: 0.9 mg/dL (ref 0.0–1.2)
CALCIUM: 9.1 mg/dL (ref 8.7–10.2)
CO2: 23 mmol/L (ref 20–29)
CREATININE: 1.16 mg/dL (ref 0.76–1.27)
Chloride: 107 mmol/L — ABNORMAL HIGH (ref 96–106)
GFR calc Af Amer: 81 mL/min/{1.73_m2} (ref >59)
GFR calc non Af Amer: 70 mL/min/{1.73_m2} (ref >59)
GLOBULIN, TOTAL: 1.9 g/dL (ref 1.5–4.5)
Glucose: 96 mg/dL (ref 65–99)
Potassium: 4.1 mmol/L (ref 3.5–5.2)
SODIUM: 143 mmol/L (ref 134–144)
TOTAL PROTEIN: 6.2 g/dL (ref 6.0–8.5)

## 2018-03-24 LAB — CBC WITH DIFFERENTIAL/PLATELET
BASOS: 1 %
Basophils Absolute: 0 10*3/uL (ref 0.0–0.2)
EOS (ABSOLUTE): 0.2 10*3/uL (ref 0.0–0.4)
Eos: 4 %
Hematocrit: 41.8 % (ref 37.5–51.0)
Hemoglobin: 14.4 g/dL (ref 13.0–17.7)
IMMATURE GRANULOCYTES: 0 %
Immature Grans (Abs): 0 10*3/uL (ref 0.0–0.1)
Lymphocytes Absolute: 1.6 10*3/uL (ref 0.7–3.1)
Lymphs: 30 %
MCH: 31.9 pg (ref 26.6–33.0)
MCHC: 34.4 g/dL (ref 31.5–35.7)
MCV: 93 fL (ref 79–97)
MONOS ABS: 0.4 10*3/uL (ref 0.1–0.9)
Monocytes: 8 %
NEUTROS ABS: 3 10*3/uL (ref 1.4–7.0)
NEUTROS PCT: 57 %
PLATELETS: 257 10*3/uL (ref 150–450)
RBC: 4.52 x10E6/uL (ref 4.14–5.80)
RDW: 12.8 % (ref 12.3–15.4)
WBC: 5.2 10*3/uL (ref 3.4–10.8)

## 2018-03-24 LAB — TSH: TSH: 2.55 u[IU]/mL (ref 0.450–4.500)

## 2018-03-24 LAB — LIPID PANEL
CHOLESTEROL TOTAL: 184 mg/dL (ref 100–199)
Chol/HDL Ratio: 3.7 ratio (ref 0.0–5.0)
HDL: 50 mg/dL (ref >39)
LDL Calculated: 113 mg/dL — ABNORMAL HIGH (ref 0–99)
TRIGLYCERIDES: 105 mg/dL (ref 0–149)
VLDL Cholesterol Cal: 21 mg/dL (ref 5–40)

## 2018-03-24 LAB — PSA: Prostate Specific Ag, Serum: 0.3 ng/mL (ref 0.0–4.0)

## 2018-03-30 ENCOUNTER — Encounter: Payer: Self-pay | Admitting: Family Medicine

## 2018-04-07 ENCOUNTER — Other Ambulatory Visit: Payer: Self-pay | Admitting: Family Medicine

## 2018-04-07 NOTE — Telephone Encounter (Signed)
Rx refill request: Hydroxyine HCL 25 mg   Last filled 03/17/18 # 60  LOV: 03/23/18  PCP: Clelia CroftShaw  Pharmacy: verified

## 2018-04-20 ENCOUNTER — Other Ambulatory Visit: Payer: Self-pay | Admitting: Family Medicine

## 2018-04-20 DIAGNOSIS — I10 Essential (primary) hypertension: Secondary | ICD-10-CM

## 2018-05-21 ENCOUNTER — Encounter: Payer: Self-pay | Admitting: Family Medicine

## 2018-06-26 ENCOUNTER — Encounter: Payer: Self-pay | Admitting: Family Medicine

## 2018-07-01 ENCOUNTER — Telehealth: Payer: Self-pay | Admitting: Family Medicine

## 2018-07-01 NOTE — Telephone Encounter (Unsigned)
Copied from CRM [id number]. Topic: Quick Communication - See Telephone Encounter >> Jul 01, 2018  9:13 AM Raquel SarnaHayes, Teresa G wrote: CRM for notification. See Telephone encounter for: 07/01/18.

## 2018-07-01 NOTE — Telephone Encounter (Signed)
Patient called form ready for pick up at 102

## 2018-07-01 NOTE — Telephone Encounter (Unsigned)
Copied from CRM [id number]. Topic: Quick Communication - See Telephone Encounter >> Jul 01, 2018  9:10 AM Raquel SarnaHayes, Teresa G wrote: Pt checking back on his insurance health form he left for Dr. Clelia CroftShaw to fill out for him. The deadline in this Friday.  PLEASE call pt back to let him know when he can come by to pick it up.  He left it back in July to be done.

## 2018-07-07 ENCOUNTER — Other Ambulatory Visit: Payer: Self-pay | Admitting: Family Medicine

## 2018-07-07 DIAGNOSIS — F411 Generalized anxiety disorder: Secondary | ICD-10-CM

## 2018-07-08 NOTE — Telephone Encounter (Signed)
Clonazepam 0.5 mg refill Last Refill:05/21/18 # 60 Last OV: 03/23/18 PCP: Clelia Croft [facility]

## 2018-07-09 ENCOUNTER — Telehealth: Payer: Self-pay | Admitting: Family Medicine

## 2018-07-09 NOTE — Telephone Encounter (Signed)
Called pt. To reschedule, left detailed VM

## 2018-07-09 NOTE — Telephone Encounter (Signed)
Pt called back; P[facility] did not reschedule due to not knowing by pt's directions if physical appt needed to be changed due to pcp availability or pcp leaving the facility completely and needing to be set up w/ a new pcp; contact to advise

## 2018-07-10 NOTE — Telephone Encounter (Signed)
Provider is not leaving the office she is just not here on the day of the scheduled appointment so patient needs to reschedule for a different day, there was a detailed message left for the patient stating this.

## 2018-07-18 NOTE — Telephone Encounter (Signed)
Telephone call from 8/28 states form was completed and pt was called to pick up form. 

## 2018-07-18 NOTE — Telephone Encounter (Signed)
Telephone call from 8/28 states form was completed and pt was called to pick up form.

## 2018-07-18 NOTE — Telephone Encounter (Signed)
Sent in refill of #60 with 2 refills  Today I have utilized the [place] Controlled Substance Registry's online query to confirm compliance regarding the patient's controlled medications. My review reveals appropriate prescription fills and that I am the sole provider of these medications. Rechecks will occur regularly and the patient is aware of our use of the system.  He did not use the last refill on the prior rx that I sent in on 12/29/17 - clonazepam 0.5mg  #60 was filled on 2/25, 5/14, and 7/18.

## 2018-07-21 ENCOUNTER — Other Ambulatory Visit: Payer: Self-pay | Admitting: Family Medicine

## 2018-07-21 NOTE — Telephone Encounter (Signed)
  Hydroxyzine Hcl 25 mg tab refill Last Refill:04/07/18 #60 with 2 refills Last OV: 03/23/18 PCP: Dr. Clelia CroftShaw

## 2018-07-28 ENCOUNTER — Ambulatory Visit (HOSPITAL_COMMUNITY)
Admission: EM | Admit: 2018-07-28 | Discharge: 2018-07-28 | Disposition: A | Discharge status: Home / Self Care | Payer: 59 | Attending: Family Medicine | Admitting: Family Medicine

## 2018-07-28 ENCOUNTER — Encounter (HOSPITAL_COMMUNITY): Payer: Self-pay | Admitting: Emergency Medicine

## 2018-07-28 ENCOUNTER — Other Ambulatory Visit: Payer: Self-pay

## 2018-07-28 DIAGNOSIS — L0291 Cutaneous abscess, unspecified: Secondary | ICD-10-CM

## 2018-07-28 MED ORDER — DOXYCYCLINE HYCLATE 100 MG PO CAPS: 100.0000 mg | ORAL_CAPSULE | Freq: Two times a day (BID) | ORAL | 0 refills | Status: AC

## 2018-07-28 NOTE — ED Provider Notes (Signed)
[facility]    CSN: [id number] Arrival date & time: 07/28/18  0801     History   Chief Complaint Chief Complaint  Patient presents with  . Abscess    HPI Kelly Sanders is a 57 y.o. male.   Pt is a 57 year old male that presents with abscess to the left upper back area since Friday. Reports that it has become more swollen, red, and tender. He has been renovating a house and unsure if he was bitten by a spider. Denies any fever, chills, or drainage from the area. Denies any hx of MRSA.   ROS per HPI      Past Medical History:  Diagnosis Date  . 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 No neuro eval.  . Anxiety   . Arthritis    osteoarthritis  . Complication of anesthesia    slow to wake   . Heme + stool, iFOBT 11/23/2013  . Herpes labialis without complication   . Hyperlipidemia    Diet-controlled  . Hypertension   . PONV (postoperative nausea and vomiting)     Patient Active Problem List   Diagnosis Date Noted  . Insomnia 02/04/2018  . 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 S/P left THA, AA 11/15/2014  . HTN (hypertension) 07/18/2012  . Anxiety 07/18/2012    Past Surgical History:  Procedure Laterality Date  . colonscopy     January 2015; [place] Dr. Leone Payor; done due to + hemoccult  . ELBOW SURGERY Right 10/2015  . left hand fibroid tumor removal    . OLECRANON BURSECTOMY Right 10/19/2015   Procedure: RIGHT ELBOW OLEBRANON BURSITECTOMY AND OSTEOPHYTE EXCISION;  Surgeon: Bradly Bienenstock, MD;  Location: [facility];  Service: Orthopedics;  Laterality: Right;  . SHOULDER ARTHROSCOPY     Bi-lateral  . TOTAL HIP ARTHROPLASTY Left 11/15/2014   Procedure: LEFT TOTAL HIP ARTHROPLASTY ANTERIOR APPROACH;  Surgeon: Shelda Pal, MD;  Location: [facility];  Service: Orthopedics;  Laterality: Left;       Home Medications    Prior to Admission medications   Medication Sig Start Date End Date Taking?  Authorizing Provider  losartan (COZAAR) 50 MG tablet TAKE 1 TABLET BY MOUTH EVERY DAY 04/22/18  Yes Sherren Mocha, MD  acyclovir (ZOVIRAX) 200 MG capsule Take 200 mg by mouth 2 (two) times daily as needed (for cold sore outbreak).  09/09/14   [provider]  acyclovir (ZOVIRAX) 800 MG tablet 1 tab po tid x 2d beginning as soon as initial sensation develops, do not wait for lesion to appear 02/04/18   Sherren Mocha, MD  clindamycin (CLEOCIN) 300 MG capsule Take 300 mg by mouth See admin instructions. Take 600mg  an hour before dental procedure    [provider]  clonazePAM (KLONOPIN) 0.5 MG tablet TAKE 0.5-1 TABLETS (0.25-0.5 MG TOTAL) BY MOUTH 2 (TWO) TIMES DAILY AS NEEDED FOR ANXIETY. 07/18/18   Sherren Mocha, MD  doxycycline (VIBRAMYCIN) 100 MG capsule Take 1 capsule (100 mg total) by mouth 2 (two) times daily. 07/28/18   Inice Sanluis, Gloris Manchester A, NP  fluticasone (FLONASE) 50 MCG/ACT nasal spray Place 2 sprays into both nostrils at bedtime. 12/29/17   Sherren Mocha, MD  hydrOXYzine (ATARAX/VISTARIL) 25 MG tablet TAKE 1 TABLET (25 MG TOTAL) BY MOUTH 3 (THREE) TIMES DAILY AS NEEDED FOR ANXIETY. 04/07/18   Sherren Mocha, MD  ibuprofen (ADVIL,MOTRIN) 200 MG tablet Take 600 mg  by mouth every 8 (eight) hours as needed (pain).    [provider]  ramelteon (ROZEREM) 8 MG tablet Take 1 tablet (8 mg total) by mouth at bedtime. Patient not taking: Reported on 03/23/2018 01/25/18   Sherren MochaShaw, Eva N, MD  Suvorexant (BELSOMRA) 10 MG TABS Take 10 mg by mouth at bedtime. Patient not taking: Reported on 03/23/2018 12/29/17   Sherren MochaShaw, Eva N, MD  Suvorexant (BELSOMRA) 15 MG TABS Take 15 mg by mouth at bedtime. Patient not taking: Reported on 03/23/2018 01/07/18   Sherren MochaShaw, Eva N, MD  Suvorexant (BELSOMRA) 20 MG TABS Take 20 mg by mouth at bedtime. Patient not taking: Reported on 03/23/2018 01/16/18   Sherren MochaShaw, Eva N, MD    Family History Family History  Problem Relation Age of Onset  . Hypertension Mother   . Lung cancer Mother   .  Parkinson's disease Father   . Colon cancer Neg Hx   . Prostate cancer Neg Hx   . Esophageal cancer Neg Hx   . Rectal cancer Neg Hx   . Stomach cancer Neg Hx   . Pancreatic cancer Neg Hx   . Liver disease Neg Hx     Social History Social History   Tobacco Use  . Smoking status: Never Smoker  . Smokeless tobacco: Never Used  Substance Use Topics  . Alcohol use: Yes    Comment: rare  . Drug use: No     Allergies   Biaxin [clarithromycin]; Penicillins; and Sulfa antibiotics   Review of Systems Review of Systems   Physical Exam Triage Vital Signs ED Triage Vitals [07/28/18 0813]  Enc Vitals Group     BP (!) 143/65     Pulse Rate 75     Resp      Temp 98 F (36.7 C)     Temp Source Oral     SpO2 98 %     Weight      Height      Head Circumference      Peak Flow      Pain Score 2     Pain Loc      Pain Edu?      Excl. in GC?    No data found.  Updated Vital Signs BP (!) 143/65 (BP Location: Left Arm)   Pulse 75   Temp 98 F (36.7 C) (Oral)   SpO2 98%   Visual Acuity Right Eye Distance:   Left Eye Distance:   Bilateral Distance:    Right Eye Near:   Left Eye Near:    Bilateral Near:     Physical Exam  Constitutional: He is oriented to person, place, and time. He appears well-developed and well-nourished.  Very pleasant. Non toxic or ill appearing.     HENT:  Head: Normocephalic and atraumatic.  Eyes: Conjunctivae are normal.  Neck: Normal range of motion.  Pulmonary/Chest: Effort normal.  Musculoskeletal: Normal range of motion.  Neurological: He is alert and oriented to person, place, and time.  Skin: Skin is warm and dry.  Approx 3 cm by 3 cm abscess to left upper back under shoulder blade. Non fluctuant, erythematous and indurated.   Psychiatric: He has a normal mood and affect.  Nursing note and vitals reviewed.    UC Treatments / Results  Labs (all labs ordered are listed, but only abnormal results are displayed) Labs Reviewed -  No data to display  EKG None  Radiology No results found.  Procedures Procedures (including critical care  time)  Medications Ordered in UC Medications - No data to display  Initial Impression / Assessment and Plan / UC Course  I have reviewed the triage vital signs and the nursing notes.  Pertinent labs & imaging results that were available during my care of the patient were reviewed by me and considered in my medical decision making (see chart for details).     No I&D warranted today Warm compresses and doxycycline Instructed to return if the abscess worsens  Final Clinical Impressions(s) / UC Diagnoses   Final diagnoses:  Abscess     Discharge Instructions     It was nice meeting you!!  We will go ahead and treat the infection with doxycycline.  Make sure you are doing the warm compresses to the area or hot showers a few times a day. Let us know if it gets worse and is not draining.  Follow up as needed for continued or worsening symptoms     ED Prescriptions    Medication Sig Dispense Auth. Provider   doxycycline (VIBRAMYCIN) 100 MG capsule Take 1 capsule (100 mg total) by mouth 2 (two) times daily. 20 capsule Dahlia Byes A, NP     Controlled Substance Prescriptions [place] Controlled Substance Registry consulted? Not Applicable   Janace Aris, NP 07/28/18 [phone]

## 2018-07-28 NOTE — ED Triage Notes (Signed)
Pt reports feeling pain around his left shoulder blade starting Friday.  He states he has a red, swollen and sore bump on his back.  He denies any fever.

## 2018-07-28 NOTE — Discharge Instructions (Addendum)
It was nice meeting you!!  We will go ahead and treat the infection with doxycycline.  Make sure you are doing the warm compresses to the area or hot showers a few times a day. Let us know if it gets worse and is not draining.  Follow up as needed for continued or worsening symptoms

## 2018-08-06 ENCOUNTER — Other Ambulatory Visit: Payer: Self-pay | Admitting: Family Medicine

## 2018-08-06 DIAGNOSIS — I10 Essential (primary) hypertension: Secondary | ICD-10-CM

## 2018-08-06 NOTE — Telephone Encounter (Signed)
Requested medication (s) are due for refill today: yes  Requested medication (s) are on the active medication list: yes  Last refill:  04/22/18  Future visit scheduled: yes  Notes to clinic:  See pharmacy note; 50 mg on back order    Requested Prescriptions  Pending Prescriptions Disp Refills   losartan (COZAAR) 25 MG tablet [Pharmacy Med Name: LOSARTAN POTASSIUM 25 MG TAB] 1 tablet 0    Sig: Please specify directions, refills and quantity     Cardiovascular:  Angiotensin Receptor Blockers Failed - 08/06/2018  2:39 PM      Failed - Last BP in normal range    BP Readings from Last 1 Encounters:  07/28/18 (!) 143/65         Passed - Cr in normal range and within 180 days    Creat  Date Value Ref Range Status  08/02/2016 1.13 0.70 - 1.33 mg/dL Final    Comment:      For patients > or = 56 years of age: The upper reference limit for Creatinine is approximately 13% higher for people identified as African-American.      Creatinine, Ser  Date Value Ref Range Status  03/23/2018 1.16 0.76 - 1.27 mg/dL Final         Passed - K in normal range and within 180 days    Potassium  Date Value Ref Range Status  03/23/2018 4.1 3.5 - 5.2 mmol/L Final         Passed - Patient is not pregnant      Passed - Valid encounter within last 6 months    Recent Outpatient Visits          4 months ago Annual physical exam   [facility], Lev[facility] July, MD   7 months ago Tinnitus of both ears   [facility], Levell July, MD   1 year ago Essential hypertension   [facility], Levell July, MD   2 years ago Hyperlipidemia   [facility], Levell July, MD   2 years ago Hyperlipidemia   [facility], Levell July, MD

## 2018-08-07 ENCOUNTER — Other Ambulatory Visit: Payer: Self-pay | Admitting: Family Medicine

## 2018-08-07 DIAGNOSIS — I10 Essential (primary) hypertension: Secondary | ICD-10-CM

## 2018-08-07 MED ORDER — LOSARTAN POTASSIUM 50 MG PO TABS: 50.0000 mg | ORAL_TABLET | Freq: Every day | ORAL | 0 refills | Status: DC

## 2018-08-07 NOTE — Telephone Encounter (Signed)
Copied from CRM [id number]. Topic: Quick Communication - Rx Refill/Question >> Aug 07, 2018 12:02 PM Jaquita Rector A wrote: Medication: losartan (COZAAR) 50 MG tablet   Patient called to get refill stated that [place] and need his refill sent in today also a call back by 3 pm. Ph# [phone]  Has the patient contacted their pharmacy? Yes   Preferred Pharmacy (with phone number or street [phone] [place] [place] - [address] AT [address] [phone] (Phone) [phone] (Fax)    Agent: Please be advised that RX refills may take up to 3 business days. We ask that you follow-up with your pharmacy.

## 2018-08-07 NOTE — Telephone Encounter (Signed)
Patient was told Losartan is being recalled. Leaving this Sunday 08/09/2018.  [phone] [place], [place] - [address]  [address] [place] [address]  Phone: [phone] Fax: [phone]

## 2018-08-13 MED ORDER — LOSARTAN POTASSIUM 25 MG PO TABS: 50.0000 mg | ORAL_TABLET | Freq: Every day | ORAL | 0 refills | Status: DC

## 2018-08-13 NOTE — Telephone Encounter (Signed)
As best I can make out from notes below, patient is on losartan 50 mg once daily but this is on back order and the pharmacy has 25 mg pills available and was wanting to permission to give patient 2 25 mg tabs daily instead of 1 50 mg daily.    As this requested change provides patient with the exact equivalent medication and dose to what I prescribed him hese are exactly equivalent and would allow the patient to continue on the medication I prescribed him, yes of course substitution is fine.  Sent in new rx for 2 25mg  tabs

## 2018-08-13 NOTE — Telephone Encounter (Signed)
[facility] [phone] [facility] and spoke to [place], [facility]. I advised it was a E-Prescribing Error for Losartan 25 mg tab, she says it was not received today. [facility] and they are on back order. She says it will have to be a 25 mg tablet and the patient take 2. I advised that is the new prescription that was sent today and I will re-submit it electronically, she verbalized understanding.

## 2018-09-23 ENCOUNTER — Encounter: Payer: Self-pay | Admitting: Family Medicine

## 2019-01-18 ENCOUNTER — Other Ambulatory Visit: Payer: Self-pay | Admitting: Family Medicine

## 2019-01-18 DIAGNOSIS — I10 Essential (primary) hypertension: Secondary | ICD-10-CM

## 2019-01-18 NOTE — Telephone Encounter (Signed)
Copied from CRM [id number]. Topic: Quick Communication - Rx Refill/Question >> Jan 18, 2019  9:41 AM Retta Diones wrote: Medication: losartan (COZAAR) 50 MG tablet   90 DAY SUPPLY  Has the patient contacted their pharmacy? Yes.   (Agent: If no, request that the patient contact the pharmacy for the refill.) (Agent: If yes, when and what did the pharmacy advise?)  Preferred Pharmacy (with phone number or street name): [id number] - [place], [place] - [address] [phone] (Phone) [phone] (Fax)    Agent: Please be advised that RX refills may take up to 3 business days. We ask that you follow-up with your pharmacy.   COMPLETELY OUT OF MEDICATION/ HAS CPE ON 03/22/19

## 2019-01-19 ENCOUNTER — Other Ambulatory Visit: Payer: Self-pay

## 2019-01-19 DIAGNOSIS — I10 Essential (primary) hypertension: Secondary | ICD-10-CM

## 2019-01-19 MED ORDER — LOSARTAN POTASSIUM 50 MG PO TABS: 50.0000 mg | ORAL_TABLET | Freq: Every day | ORAL | 0 refills | Status: DC

## 2019-03-01 ENCOUNTER — Encounter: Payer: Self-pay | Admitting: Emergency Medicine

## 2019-03-19 ENCOUNTER — Other Ambulatory Visit: Payer: Self-pay | Admitting: *Deleted

## 2019-03-19 DIAGNOSIS — E782 Mixed hyperlipidemia: Secondary | ICD-10-CM

## 2019-03-19 DIAGNOSIS — I10 Essential (primary) hypertension: Secondary | ICD-10-CM

## 2019-03-19 DIAGNOSIS — Z1329 Encounter for screening for other suspected endocrine disorder: Secondary | ICD-10-CM

## 2019-03-22 ENCOUNTER — Encounter: Payer: Self-pay | Admitting: Emergency Medicine

## 2019-03-22 ENCOUNTER — Ambulatory Visit (INDEPENDENT_AMBULATORY_CARE_PROVIDER_SITE_OTHER): Payer: Managed Care, Other (non HMO) | Admitting: Emergency Medicine

## 2019-03-22 ENCOUNTER — Other Ambulatory Visit: Payer: Self-pay

## 2019-03-22 ENCOUNTER — Telehealth: Payer: Self-pay | Admitting: Emergency Medicine

## 2019-03-22 VITALS — BP 130/70 | HR 79 | Temp 98.6°F | Resp 16 | Ht 71.0 in | Wt 175.0 lb

## 2019-03-22 DIAGNOSIS — Z8619 Personal history of other infectious and parasitic diseases: Secondary | ICD-10-CM

## 2019-03-22 DIAGNOSIS — Z125 Encounter for screening for malignant neoplasm of prostate: Secondary | ICD-10-CM

## 2019-03-22 DIAGNOSIS — Z87448 Personal history of other diseases of urinary system: Secondary | ICD-10-CM | POA: Diagnosis not present

## 2019-03-22 DIAGNOSIS — Z0001 Encounter for general adult medical examination with abnormal findings: Secondary | ICD-10-CM | POA: Diagnosis not present

## 2019-03-22 DIAGNOSIS — F419 Anxiety disorder, unspecified: Secondary | ICD-10-CM

## 2019-03-22 DIAGNOSIS — Z0000 Encounter for general adult medical examination without abnormal findings: Secondary | ICD-10-CM

## 2019-03-22 DIAGNOSIS — R748 Abnormal levels of other serum enzymes: Secondary | ICD-10-CM | POA: Insufficient documentation

## 2019-03-22 DIAGNOSIS — I10 Essential (primary) hypertension: Secondary | ICD-10-CM | POA: Diagnosis not present

## 2019-03-22 DIAGNOSIS — Z13220 Encounter for screening for lipoid disorders: Secondary | ICD-10-CM

## 2019-03-22 LAB — POCT URINALYSIS DIP (MANUAL ENTRY)
Bilirubin, UA: NEGATIVE
Glucose, UA: NEGATIVE mg/dL
Ketones, POC UA: NEGATIVE mg/dL
Leukocytes, UA: NEGATIVE
Nitrite, UA: NEGATIVE
Protein Ur, POC: NEGATIVE mg/dL
Spec Grav, UA: >=1.03 — AB (ref 1.010–1.025)
Urobilinogen, UA: 0.2 E.U./dL
pH, UA: 5.5 (ref 5.0–8.0)

## 2019-03-22 LAB — CBC WITH DIFFERENTIAL/PLATELET
Basophils Absolute: 0.1 10*3/uL (ref 0.0–0.2)
Basos: 1 %
EOS (ABSOLUTE): 0.1 10*3/uL (ref 0.0–0.4)
Eos: 3 %
Hematocrit: 45.1 % (ref 37.5–51.0)
Hemoglobin: 15.3 g/dL (ref 13.0–17.7)
Immature Grans (Abs): 0 10*3/uL (ref 0.0–0.1)
Immature Granulocytes: 0 %
Lymphocytes Absolute: 1.5 10*3/uL (ref 0.7–3.1)
Lymphs: 30 %
MCH: 32.2 pg (ref 26.6–33.0)
MCHC: 33.9 g/dL (ref 31.5–35.7)
MCV: 95 fL (ref 79–97)
Monocytes Absolute: 0.4 10*3/uL (ref 0.1–0.9)
Monocytes: 9 %
Neutrophils Absolute: 2.8 10*3/uL (ref 1.4–7.0)
Neutrophils: 57 %
Platelets: 254 10*3/uL (ref 150–450)
RBC: 4.75 x10E6/uL (ref 4.14–5.80)
RDW: 11.9 % (ref 11.6–15.4)
WBC: 4.9 10*3/uL (ref 3.4–10.8)

## 2019-03-22 LAB — COMPREHENSIVE METABOLIC PANEL
ALT: 29 IU/L (ref 0–44)
AST: 26 IU/L (ref 0–40)
Albumin/Globulin Ratio: 2.2 (ref 1.2–2.2)
Albumin: 4.4 g/dL (ref 3.8–4.9)
Alkaline Phosphatase: 94 IU/L (ref 39–117)
BUN/Creatinine Ratio: 15 (ref 9–20)
BUN: 19 mg/dL (ref 6–24)
Bilirubin Total: 0.5 mg/dL (ref 0.0–1.2)
CO2: 22 mmol/L (ref 20–29)
Calcium: 9.7 mg/dL (ref 8.7–10.2)
Chloride: 107 mmol/L — ABNORMAL HIGH (ref 96–106)
Creatinine, Ser: 1.28 mg/dL — ABNORMAL HIGH (ref 0.76–1.27)
GFR calc Af Amer: 72 mL/min/{1.73_m2} (ref >59)
GFR calc non Af Amer: 62 mL/min/{1.73_m2} (ref >59)
Globulin, Total: 2 g/dL (ref 1.5–4.5)
Glucose: 102 mg/dL — ABNORMAL HIGH (ref 65–99)
Potassium: 4.8 mmol/L (ref 3.5–5.2)
Sodium: 141 mmol/L (ref 134–144)
Total Protein: 6.4 g/dL (ref 6.0–8.5)

## 2019-03-22 LAB — LIPID PANEL
Chol/HDL Ratio: 3.7 ratio (ref 0.0–5.0)
Cholesterol, Total: 187 mg/dL (ref 100–199)
HDL: 51 mg/dL (ref >39)
LDL Calculated: 111 mg/dL — ABNORMAL HIGH (ref 0–99)
Triglycerides: 124 mg/dL (ref 0–149)
VLDL Cholesterol Cal: 25 mg/dL (ref 5–40)

## 2019-03-22 MED ORDER — LOSARTAN POTASSIUM 50 MG PO TABS: 50.0000 mg | ORAL_TABLET | Freq: Every day | ORAL | 0 refills | Status: DC

## 2019-03-22 MED ORDER — CLONAZEPAM 0.5 MG PO TABS: 0.2500 mg | ORAL_TABLET | Freq: Every day | ORAL | 1 refills | Status: AC | PRN

## 2019-03-22 MED ORDER — HYDROXYZINE HCL 25 MG PO TABS: 25.0000 mg | ORAL_TABLET | Freq: Two times a day (BID) | ORAL | 1 refills | Status: DC | PRN

## 2019-03-22 MED ORDER — ACYCLOVIR 200 MG PO CAPS: 200.0000 mg | ORAL_CAPSULE | Freq: Two times a day (BID) | ORAL | 5 refills | Status: AC | PRN

## 2019-03-22 NOTE — Telephone Encounter (Signed)
Verified Rx for pharmacy, she verbalized understanding.

## 2019-03-22 NOTE — Telephone Encounter (Signed)
Copied from CRM [id number]. Topic: Quick Communication - Rx Refill/Question >> Mar 22, 2019 10:33 AM Lynne Logan D wrote: Medication: acyclovir (ZOVIRAX) 200 MG capsule / Pharmacy called to see if it is ok to switch acyclovir 200mg  twice a day to 400 mg once a day. Please advise.  Has the patient contacted their pharmacy? Yes.   (Agent: If no, request that the patient contact the pharmacy for the refill.) (Agent: If yes, when and what did the pharmacy advise?)  Preferred Pharmacy (with phone number or street name): [id number] - [place], [place] - [address] [phone] (Phone) [phone] (Fax)    Agent: Please be advised that RX refills may take up to 3 business days. We ask that you follow-up with your pharmacy.

## 2019-03-22 NOTE — Progress Notes (Signed)
BP Readings from Last 3 Encounters:  03/22/19 130/70  07/28/18 (!) 143/65  03/23/18 126/72   Kelly Sanders 58 y.o.   Chief Complaint  Patient presents with  . Annual Exam  . Establish Care  . Medication Refill    Acyclovir  and , clonazepam, hydroxyzine and Losartan    HISTORY OF PRESENT ILLNESS: This is a 58 y.o. male here for his annual exam and to establish care with me.  Used to see Dr. Clelia Croft. Has a history of hypertension and chronic anxiety.  Requesting medication refills. Medical health self grade: A minus. Good nutrition.  Weight is 175 pounds.  Stays physically active.  Sleeps well.  Non-smoker.  No EtOH abuse. Wt Readings from Last 3 Encounters:  03/22/19 175 lb (79.4 kg)  03/23/18 171 lb 9.6 oz (77.8 kg)  12/29/17 170 lb 3.2 oz (77.2 kg)  Health maintenance reviewed.  Up-to-date.   HPI   Prior to Admission medications   Medication Sig Start Date End Date Taking? Authorizing Provider  acyclovir (ZOVIRAX) 200 MG capsule Take 200 mg by mouth 2 (two) times daily as needed (for cold sore outbreak).  09/09/14  Yes [provider]  acyclovir (ZOVIRAX) 800 MG tablet 1 tab po tid x 2d beginning as soon as initial sensation develops, do not wait for lesion to appear 02/04/18  Yes Sherren Mocha, MD  clindamycin (CLEOCIN) 300 MG capsule Take 300 mg by mouth See admin instructions. Take  an hour before dental procedure   Yes [provider]  clonazePAM (KLONOPIN) 0.5 MG tablet TAKE 0.5-1 TABLETS (0.25-0.5 MG TOTAL) BY MOUTH 2 (TWO) TIMES DAILY AS NEEDED FOR ANXIETY. 07/18/18  Yes Sherren Mocha, MD  doxycycline (VIBRAMYCIN) 100 MG capsule Take 1 capsule (100 mg total) by mouth 2 (two) times daily. 07/28/18  Yes Bast, Traci A, NP  fluticasone (FLONASE) 50 MCG/ACT nasal spray Place 2 sprays into both nostrils at bedtime. 12/29/17  Yes Sherren Mocha, MD  hydrOXYzine (ATARAX/VISTARIL) 25 MG tablet TAKE 1 TABLET (25 MG TOTAL) BY MOUTH 3 (THREE) TIMES DAILY AS  NEEDED FOR ANXIETY. 04/07/18  Yes Sherren Mocha, MD  ibuprofen (ADVIL,MOTRIN) 200 MG tablet Take 600 mg by mouth every 8 (eight) hours as needed (pain).   Yes [provider]  losartan (COZAAR) 50 MG tablet Take 1 tablet (50 mg total) by mouth daily. 01/19/19  Yes Dorcus Riga, Eilleen Kempf, MD  ramelteon (ROZEREM) 8 MG tablet Take 1 tablet (8 mg total) by mouth at bedtime. 01/25/18  Yes Sherren Mocha, MD  Suvorexant (BELSOMRA) 10 MG TABS Take 10 mg by mouth at bedtime. 12/29/17  Yes Sherren Mocha, MD  Suvorexant (BELSOMRA) 15 MG TABS Take 15 mg by mouth at bedtime. 01/07/18  Yes Sherren Mocha, MD  Suvorexant (BELSOMRA) 20 MG TABS Take 20 mg by mouth at bedtime. 01/16/18  Yes Sherren Mocha, MD    Allergies  Allergen Reactions  . Biaxin [Clarithromycin] Rash  . Penicillins Rash    Has patient had a PCN reaction causing immediate rash, facial/tongue/throat swelling, SOB or lightheadedness with hypotension: No  Has patient had a PCN reaction causing severe rash involving mucus membranes or skin necrosis: No Has patient had a PCN reaction that required hospitalization: No  Has patient had a PCN reaction occurring within the last 10 years: No If all of the above answers are "NO", then may proceed with Cephalosporin use.   . Sulfa Antibiotics Rash    Patient  Active Problem List   Diagnosis Date Noted  . Insomnia 02/04/2018  . 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 S/P left THA, AA 11/15/2014  . HTN (hypertension) 07/18/2012  . Anxiety 07/18/2012    Past Medical History:  Diagnosis Date  . 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 No neuro eval.  . Anxiety   . Arthritis    osteoarthritis  . Complication of anesthesia    slow to wake   . Heme + stool, iFOBT 11/23/2013  . Herpes labialis without complication   . Hyperlipidemia    Diet-controlled  . Hypertension   . PONV (postoperative nausea and vomiting)     Past Surgical History:  Procedure  Laterality Date  . colonscopy     January 2015; [place] Dr. Leone Payor; done due to + hemoccult  . ELBOW SURGERY Right 10/2015  . left hand fibroid tumor removal    . OLECRANON BURSECTOMY Right 10/19/2015   Procedure: RIGHT ELBOW OLEBRANON BURSITECTOMY AND OSTEOPHYTE EXCISION;  Surgeon: Bradly Bienenstock, MD;  Location: [facility];  Service: Orthopedics;  Laterality: Right;  . SHOULDER ARTHROSCOPY     Bi-lateral  . TOTAL HIP ARTHROPLASTY Left 11/15/2014   Procedure: LEFT TOTAL HIP ARTHROPLASTY ANTERIOR APPROACH;  Surgeon: Shelda Pal, MD;  Location: [facility];  Service: Orthopedics;  Laterality: Left;    Social History   Socioeconomic History  . Marital status: Married    Spouse name: Not on file  . Number of children: 2  . Years of education: Not on file  . Highest education level: Bachelor's degree (e.g., BA, AB, BS)  Occupational History  . Occupation: Radio producer  Social Needs  . Financial resource strain: Not on file  . Food insecurity:    Worry: Not on file    Inability: Not on file  . Transportation needs:    Medical: Not on file    Non-medical: Not on file  Tobacco Use  . Smoking status: Never Smoker  . Smokeless tobacco: Never Used  Substance and Sexual Activity  . Alcohol use: Yes    Comment: rare  . Drug use: No  . Sexual activity: Yes  Lifestyle  . Physical activity:    Days per week: Not on file    Minutes per session: Not on file  . Stress: Not on file  Relationships  . Social connections:    Talks on phone: Not on file    Gets together: Not on file    Attends religious service: Not on file    Active member of club or organization: Not on file    Attends meetings of clubs or organizations: Not on file    Relationship status: Not on file  . Intimate partner violence:    Fear of current or ex partner: Not on file    Emotionally abused: Not on file    Physically abused: Not on file    Forced sexual activity: Not on file  Other Topics Concern  . Not on file   Social History Narrative   Divorced 2016. Education: Visual merchandiser for [employer]   Son born 65; daughter born 1998   2 caffeinated beverages daily    Family History  Problem Relation Age of Onset  . Hypertension Mother   . Lung cancer Mother   . Parkinson's disease Father   . Colon cancer Neg Hx   . Prostate cancer Neg Hx   .  Esophageal cancer Neg Hx   . Rectal cancer Neg Hx   . Stomach cancer Neg Hx   . Pancreatic cancer Neg Hx   . Liver disease Neg Hx      Review of Systems  Constitutional: Negative.  Negative for chills, fever and weight loss.  HENT: Positive for tinnitus (Chronic, he has had work-ups for). Negative for sore throat.   Eyes: Negative for pain, discharge and redness.       Chronically dilated right pupil with troubles adjusting to light changes  Respiratory: Negative.  Negative for cough, hemoptysis and shortness of breath.   Cardiovascular: Negative.  Negative for chest pain and palpitations.  Gastrointestinal: Negative.  Negative for abdominal pain, blood in stool, constipation, diarrhea, melena, nausea and vomiting.  Genitourinary: Negative.  Negative for dysuria, flank pain, frequency, hematuria and urgency.  Musculoskeletal: Negative.  Negative for back pain, myalgias and neck pain.  Skin: Negative.  Negative for rash.  Neurological: Negative.  Negative for dizziness, sensory change, speech change, focal weakness, seizures, loss of consciousness and headaches.  Endo/Heme/Allergies: Negative.   Psychiatric/Behavioral: The patient is nervous/anxious (Chronic since age 79).   All other systems reviewed and are negative.  Vitals:   03/22/19 0852  BP: 130/70  Pulse: 79  Resp: 16  Temp: 98.6 F (37 C)  SpO2: 97%     Physical Exam Vitals signs reviewed.  Constitutional:      Appearance: Normal appearance. He is normal weight.  HENT:     Head: Normocephalic and atraumatic.     Nose: Nose normal.      Mouth/Throat:     Mouth: Mucous membranes are moist.     Pharynx: Oropharynx is clear.  Eyes:     Extraocular Movements: Extraocular movements intact.     Conjunctiva/sclera: Conjunctivae normal.     Comments: Chronically dilated right pupil  Neck:     Musculoskeletal: Normal range of motion and neck supple.  Cardiovascular:     Rate and Rhythm: Regular rhythm.     Pulses: Normal pulses.     Heart sounds: Normal heart sounds.  Pulmonary:     Effort: Pulmonary effort is normal.     Breath sounds: Normal breath sounds.  Abdominal:     General: Abdomen is flat. Bowel sounds are normal. There is no distension.     Palpations: Abdomen is soft. There is no mass.     Tenderness: There is no abdominal tenderness. There is no rebound.     Hernia: No hernia is present.  Musculoskeletal: Normal range of motion.     Right lower leg: No edema.     Left lower leg: No edema.  Lymphadenopathy:     Cervical: No cervical adenopathy.  Skin:    General: Skin is warm and dry.     Capillary Refill: Capillary refill takes less than 2 seconds.  Neurological:     General: No focal deficit present.     Mental Status: He is alert and oriented to person, place, and time.     Sensory: No sensory deficit.     Motor: No weakness.     Coordination: Coordination normal.     Gait: Gait normal.  Psychiatric:        Mood and Affect: Mood normal.        Behavior: Behavior normal.      ASSESSMENT & PLAN: Kelly Sanders was seen today for annual exam, establish care and medication refill.  Diagnoses and all orders for this visit:  Routine general medical examination at a health care facility  Essential hypertension -     CBC with Differential/Platelet -     Comprehensive metabolic panel -     losartan (COZAAR) 50 MG tablet; Take 1 tablet (50 mg total) by mouth daily.  Prostate cancer screening -     PSA  History of hematuria -     POCT urinalysis dipstick  Elevated serum GGT level -     Gamma GT   H/O cold sores -     acyclovir (ZOVIRAX) 200 MG capsule; Take 1 capsule (200 mg total) by mouth 2 (two) times daily as needed (for cold sore outbreak).  Chronic anxiety -     clonazePAM (KLONOPIN) 0.5 MG tablet; Take 0.5-1 tablets (0.25-0.5 mg total) by mouth daily as needed for anxiety. -     hydrOXYzine (ATARAX/VISTARIL) 25 MG tablet; Take 1 tablet (25 mg total) by mouth 2 (two) times daily as needed for anxiety.  Screening for lipoid disorders -     Lipid panel  Other orders -     Cancel: Thyroid Panel With TSH    Patient Instructions       If you have lab work done today you will be contacted with your lab results within the next 2 weeks.  If you have not heard from us then please contact us. The fastest way to get your results is to register for My Chart.   IF you received an x-ray today, you will receive an invoice from [facility]. Please contact [facility] at [phone] with questions or concerns regarding your invoice.   IF you received labwork today, you will receive an invoice from [facility]. Please contact [facility] at [phone] with questions or concerns regarding your invoice.   Our billing staff will not be able to assist you with questions regarding bills from these companies.  You will be contacted with the lab results as soon as they are available. The fastest way to get your results is to activate your My Chart account. Instructions are located on the last page of this paperwork. If you have not heard from us regarding the results in 2 weeks, please contact this office.      Health Maintenance, Male A healthy lifestyle and preventive care is important for your health and wellness. Ask your health care provider about what schedule of regular examinations is right for you. What should I know about weight and diet? Eat a Healthy Diet  Eat plenty of vegetables, fruits, whole grains, low-fat dairy products, and lean protein.  Do not eat a  lot of foods high in solid fats, added sugars, or salt.  Maintain a Healthy Weight Regular exercise can help you achieve or maintain a healthy weight. You should:  Do at least 150 minutes of exercise each week. The exercise should increase your heart rate and make you sweat (moderate-intensity exercise).  Do strength-training exercises at least twice a week. Watch Your Levels of Cholesterol and Blood Lipids  Have your blood tested for lipids and cholesterol every 5 years starting at 57 years of age. If you are at high risk for heart disease, you should start having your blood tested when you are 57 years old. You may need to have your cholesterol levels checked more often if: ? Your lipid or cholesterol levels are high. ? You are older than 57 years of age. ? You are at high risk for heart disease. What should I know about cancer screening? Many  types of cancers can be detected early and may often be prevented. Lung Cancer  You should be screened every year for lung cancer if: ? You are a current smoker who has smoked for at least 30 years. ? You are a former smoker who has quit within the past 15 years.  Talk to your health care provider about your screening options, when you should start screening, and how often you should be screened. Colorectal Cancer  Routine colorectal cancer screening usually begins at 57 years of age and should be repeated every 5-10 years until you are 57 years old. You may need to be screened more often if early forms of precancerous polyps or small growths are found. Your health care provider may recommend screening at an earlier age if you have risk factors for colon cancer.  Your health care provider may recommend using home test kits to check for hidden blood in the stool.  A small camera at the end of a tube can be used to examine your colon (sigmoidoscopy or colonoscopy). This checks for the earliest forms of colorectal cancer. Prostate and Testicular  Cancer  Depending on your age and overall health, your health care provider may do certain tests to screen for prostate and testicular cancer.  Talk to your health care provider about any symptoms or concerns you have about testicular or prostate cancer. Skin Cancer  Check your skin from head to toe regularly.  Tell your health care provider about any new moles or changes in moles, especially if: ? There is a change in a mole's size, shape, or color. ? You have a mole that is larger than a pencil eraser.  Always use sunscreen. Apply sunscreen liberally and repeat throughout the day.  Protect yourself by wearing long sleeves, pants, a wide-brimmed hat, and sunglasses when outside. What should I know about heart disease, diabetes, and high blood pressure?  If you are 31-26 years of age, have your blood pressure checked every 3-5 years. If you are 22 years of age or older, have your blood pressure checked every year. You should have your blood pressure measured twice-once when you are at a hospital or clinic, and once when you are not at a hospital or clinic. Record the average of the two measurements. To check your blood pressure when you are not at a hospital or clinic, you can use: ? An automated blood pressure machine at a pharmacy. ? A home blood pressure monitor.  Talk to your health care provider about your target blood pressure.  If you are between 64-60 years old, ask your health care provider if you should take aspirin to prevent heart disease.  Have regular diabetes screenings by checking your fasting blood sugar level. ? If you are at a normal weight and have a low risk for diabetes, have this test once every three years after the age of 67. ? If you are overweight and have a high risk for diabetes, consider being tested at a younger age or more often.  A one-time screening for abdominal aortic aneurysm (AAA) by ultrasound is recommended for men aged 65-75 years who are current  or former smokers. What should I know about preventing infection? Hepatitis B If you have a higher risk for hepatitis B, you should be screened for this virus. Talk with your health care provider to find out if you are at risk for hepatitis B infection. Hepatitis C Blood testing is recommended for:  Everyone born from [birth year] through  74.  Anyone with known risk factors for hepatitis C. Sexually Transmitted Diseases (STDs)  You should be screened each year for STDs including gonorrhea and chlamydia if: ? You are sexually active and are younger than 57 years of age. ? You are older than 57 years of age and your health care provider tells you that you are at risk for this type of infection. ? Your sexual activity has changed since you were last screened and you are at an increased risk for chlamydia or gonorrhea. Ask your health care provider if you are at risk.  Talk with your health care provider about whether you are at high risk of being infected with HIV. Your health care provider may recommend a prescription medicine to help prevent HIV infection. What else can I do?  Schedule regular health, dental, and eye exams.  Stay current with your vaccines (immunizations).  Do not use any tobacco products, such as cigarettes, chewing tobacco, and e-cigarettes. If you need help quitting, ask your health care provider.  Limit alcohol intake to no more than 2 drinks per day. One drink equals 12 ounces of beer, 5 ounces of wine, or 1 ounces of hard liquor.  Do not use street drugs.  Do not share needles.  Ask your health care provider for help if you need support or information about quitting drugs.  Tell your health care provider if you often feel depressed.  Tell your health care provider if you have ever been abused or do not feel safe at home. This information is not intended to replace advice given to you by your health care provider. Make sure you discuss any questions you have with  your health care provider. Document Released: 04/18/2008 Document Revised: 06/19/2016 Document Reviewed: 07/25/2015 Elsevier Interactive Patient Education  2019 Elsevier Inc.      Edwina Barth, MD [facility]

## 2019-03-22 NOTE — Patient Instructions (Addendum)

## 2019-03-23 ENCOUNTER — Encounter: Payer: Self-pay | Admitting: Emergency Medicine

## 2019-03-23 LAB — GAMMA GT: GGT: 103 IU/L — ABNORMAL HIGH (ref 0–65)

## 2019-03-23 LAB — PSA: Prostate Specific Ag, Serum: 0.2 ng/mL (ref 0.0–4.0)

## 2019-03-25 ENCOUNTER — Encounter: Payer: 59 | Admitting: Family Medicine

## 2019-03-30 ENCOUNTER — Other Ambulatory Visit: Payer: Self-pay | Admitting: Emergency Medicine

## 2019-03-30 ENCOUNTER — Telehealth: Payer: Self-pay | Admitting: Emergency Medicine

## 2019-03-30 MED ORDER — CLINDAMYCIN HCL 300 MG PO CAPS: 300.0000 mg | ORAL_CAPSULE | ORAL | 1 refills | Status: AC

## 2019-03-30 NOTE — Telephone Encounter (Signed)
Prescription sent. Thanks

## 2019-03-30 NOTE — Telephone Encounter (Signed)
Copied from CRM [id number]. Topic: Quick Communication - See Telephone Encounter >> Mar 30, 2019  9:08 AM Debroah Loop wrote: CRM for notification. See Telephone encounter for: 03/30/19. Patient would like clindamycin (CLEOCIN) 300 MG capsule called in because he has dental procedure tomorrow morning.

## 2019-06-08 ENCOUNTER — Other Ambulatory Visit: Payer: Self-pay | Admitting: Emergency Medicine

## 2019-06-08 DIAGNOSIS — I10 Essential (primary) hypertension: Secondary | ICD-10-CM

## 2019-06-08 NOTE — Telephone Encounter (Signed)
Requested Prescriptions  Pending Prescriptions Disp Refills  . losartan (COZAAR) 50 MG tablet [Pharmacy Med Name: Losartan Potassium Oral Tablet 50 MG] 90 tablet 2    Sig: TAKE 1 TABLET BY MOUTH DAILY     Cardiovascular:  Angiotensin Receptor Blockers Failed - 06/08/2019  9:59 AM      Failed - Cr in normal range and within 180 days    Creat  Date Value Ref Range Status  08/02/2016 1.13 0.70 - 1.33 mg/dL Final    Comment:      For patients > or = 57 years of age: The upper reference limit for Creatinine is approximately 13% higher for people identified as African-American.      Creatinine, Ser  Date Value Ref Range Status  03/22/2019 1.28 (H) 0.76 - 1.27 mg/dL Final         Passed - K in normal range and within 180 days    Potassium  Date Value Ref Range Status  03/22/2019 4.8 3.5 - 5.2 mmol/L Final         Passed - Patient is not pregnant      Passed - Last BP in normal range    BP Readings from Last 1 Encounters:  03/22/19 130/70         Passed - Valid encounter within last 6 months    Recent Outpatient Visits          2 months ago Routine general medical examination at a health care facility   [facility], Ines Bloomer, MD   1 year ago Annual physical exam   [facility], Laurey Arrow, MD   1 year ago Tinnitus of both ears   [facility], Laurey Arrow, MD   2 years ago Essential hypertension   [facility], Laurey Arrow, MD   2 years ago Hyperlipidemia   [facility], Laurey Arrow, MD      Future Appointments            In 9 months Sagardia, Ines Bloomer, MD [facility] [place], [facility]

## 2019-11-06 ENCOUNTER — Other Ambulatory Visit: Payer: Self-pay | Admitting: Emergency Medicine

## 2019-11-06 DIAGNOSIS — F419 Anxiety disorder, unspecified: Secondary | ICD-10-CM

## 2020-03-27 ENCOUNTER — Encounter: Payer: Managed Care, Other (non HMO) | Admitting: Emergency Medicine

## 2023-11-24 ENCOUNTER — Encounter: Payer: Self-pay | Admitting: Internal Medicine
